# Patient Record
Sex: Female | Born: 1985
Health system: Southern US, Community
[De-identification: ages and names within clinical notes are randomized; demographics above are authoritative.]

## PROBLEM LIST (undated history)

## (undated) DIAGNOSIS — I341 Nonrheumatic mitral (valve) prolapse: Secondary | ICD-10-CM

## (undated) DIAGNOSIS — E282 Polycystic ovarian syndrome: Secondary | ICD-10-CM

## (undated) DIAGNOSIS — F909 Attention-deficit hyperactivity disorder, unspecified type: Secondary | ICD-10-CM

## (undated) DIAGNOSIS — F419 Anxiety disorder, unspecified: Secondary | ICD-10-CM

## (undated) DIAGNOSIS — N289 Disorder of kidney and ureter, unspecified: Secondary | ICD-10-CM

## (undated) HISTORY — PX: TENDON REPAIR: SHX5111

## (undated) HISTORY — PX: BREAST REDUCTION SURGERY: SHX8

---
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
## 2004-07-19 ENCOUNTER — Other Ambulatory Visit: Admission: RE | Admit: 2004-07-19 | Discharge: 2004-07-19 | Payer: Self-pay

## 2004-09-30 ENCOUNTER — Ambulatory Visit: Payer: Self-pay | Admitting: Pulmonary Disease

## 2004-10-06 ENCOUNTER — Ambulatory Visit (HOSPITAL_COMMUNITY): Admission: RE | Admit: 2004-10-06 | Discharge: 2004-10-06 | Payer: Self-pay | Admitting: Pulmonary Disease

## 2009-04-07 ENCOUNTER — Emergency Department (HOSPITAL_COMMUNITY): Admission: EM | Admit: 2009-04-07 | Discharge: 2009-04-08 | Payer: Self-pay | Admitting: Emergency Medicine

## 2009-05-24 ENCOUNTER — Emergency Department (HOSPITAL_COMMUNITY): Admission: EM | Admit: 2009-05-24 | Discharge: 2009-05-24 | Payer: Self-pay | Admitting: Emergency Medicine

## 2010-03-22 ENCOUNTER — Emergency Department (HOSPITAL_BASED_OUTPATIENT_CLINIC_OR_DEPARTMENT_OTHER): Admission: EM | Admit: 2010-03-22 | Discharge: 2010-03-22 | Payer: Self-pay | Admitting: Emergency Medicine

## 2010-07-18 ENCOUNTER — Inpatient Hospital Stay (HOSPITAL_COMMUNITY)
Admission: AD | Admit: 2010-07-18 | Discharge: 2010-07-18 | Disposition: A | Discharge status: Home / Self Care | Payer: 59 | Source: Ambulatory Visit | Attending: Family Medicine | Admitting: Family Medicine

## 2010-07-18 DIAGNOSIS — N949 Unspecified condition associated with female genital organs and menstrual cycle: Secondary | ICD-10-CM | POA: Insufficient documentation

## 2010-07-18 DIAGNOSIS — N938 Other specified abnormal uterine and vaginal bleeding: Secondary | ICD-10-CM | POA: Insufficient documentation

## 2010-07-18 LAB — WET PREP, GENITAL
Trich, Wet Prep: NONE SEEN
Yeast Wet Prep HPF POC: NONE SEEN

## 2010-07-18 LAB — URINALYSIS, ROUTINE W REFLEX MICROSCOPIC
Hgb urine dipstick: NEGATIVE
Protein, ur: NEGATIVE mg/dL
Specific Gravity, Urine: 1.025 (ref 1.005–1.030)
Urobilinogen, UA: 0.2 mg/dL (ref 0.0–1.0)

## 2010-07-18 LAB — POCT PREGNANCY, URINE: Preg Test, Ur: NEGATIVE

## 2010-07-19 LAB — LIPASE, BLOOD: Lipase: 65 U/L (ref 23–300)

## 2010-07-19 LAB — COMPREHENSIVE METABOLIC PANEL
AST: 23 U/L (ref 0–37)
BUN: 11 mg/dL (ref 6–23)
CO2: 21 mEq/L (ref 19–32)
Calcium: 9.4 mg/dL (ref 8.4–10.5)
Chloride: 105 mEq/L (ref 96–112)
Creatinine, Ser: 0.7 mg/dL (ref 0.4–1.2)
GFR calc non Af Amer: >60 mL/min (ref >60)
Glucose, Bld: 115 mg/dL — ABNORMAL HIGH (ref 70–99)
Total Bilirubin: 0.8 mg/dL (ref 0.3–1.2)

## 2010-07-19 LAB — URINALYSIS, ROUTINE W REFLEX MICROSCOPIC
Glucose, UA: NEGATIVE mg/dL
Hgb urine dipstick: NEGATIVE
Ketones, ur: NEGATIVE mg/dL
Protein, ur: NEGATIVE mg/dL
Urobilinogen, UA: 0.2 mg/dL (ref 0.0–1.0)

## 2010-07-19 LAB — DIFFERENTIAL
Basophils Absolute: 0.1 10*3/uL (ref 0.0–0.1)
Eosinophils Relative: 0 % (ref 0–5)
Lymphs Abs: 1.4 10*3/uL (ref 0.7–4.0)
Monocytes Relative: 7 % (ref 3–12)
Neutrophils Relative %: 79 % — ABNORMAL HIGH (ref 43–77)

## 2010-07-19 LAB — CBC
HCT: 39.3 % (ref 36.0–46.0)
Hemoglobin: 13.8 g/dL (ref 12.0–15.0)
MCH: 30 pg (ref 26.0–34.0)
MCHC: 35.2 g/dL (ref 30.0–36.0)
MCV: 85.2 fL (ref 78.0–100.0)
RBC: 4.61 MIL/uL (ref 3.87–5.11)

## 2010-07-19 LAB — GC/CHLAMYDIA PROBE AMP, GENITAL: GC Probe Amp, Genital: NEGATIVE

## 2010-08-09 LAB — DIFFERENTIAL
Basophils Absolute: 0.1 10*3/uL (ref 0.0–0.1)
Eosinophils Absolute: 0.2 10*3/uL (ref 0.0–0.7)
Eosinophils Relative: 2 % (ref 0–5)
Neutrophils Relative %: 41 % — ABNORMAL LOW (ref 43–77)

## 2010-08-09 LAB — POCT I-STAT, CHEM 8
HCT: 38 % (ref 36.0–46.0)
Hemoglobin: 12.9 g/dL (ref 12.0–15.0)
Potassium: 3.8 mEq/L (ref 3.5–5.1)
Sodium: 139 mEq/L (ref 135–145)
TCO2: 24 mmol/L (ref 0–100)

## 2010-08-09 LAB — POCT CARDIAC MARKERS
CKMB, poc: <1 ng/mL — ABNORMAL LOW (ref 1.0–8.0)
Myoglobin, poc: 56.6 ng/mL (ref 12–200)

## 2010-08-09 LAB — CBC
HCT: 36.1 % (ref 36.0–46.0)
MCV: 88.7 fL (ref 78.0–100.0)
Platelets: 270 10*3/uL (ref 150–400)
RDW: 12.1 % (ref 11.5–15.5)
WBC: 10.1 10*3/uL (ref 4.0–10.5)

## 2010-10-08 ENCOUNTER — Emergency Department (HOSPITAL_COMMUNITY): Payer: 59

## 2010-10-08 ENCOUNTER — Emergency Department (HOSPITAL_COMMUNITY)
Admission: EM | Admit: 2010-10-08 | Discharge: 2010-10-08 | Disposition: A | Discharge status: Home / Self Care | Payer: 59 | Attending: Emergency Medicine | Admitting: Emergency Medicine

## 2010-10-08 DIAGNOSIS — N201 Calculus of ureter: Secondary | ICD-10-CM | POA: Insufficient documentation

## 2010-10-08 DIAGNOSIS — R1032 Left lower quadrant pain: Secondary | ICD-10-CM | POA: Insufficient documentation

## 2010-10-08 LAB — BASIC METABOLIC PANEL
CO2: 27 mEq/L (ref 19–32)
Calcium: 10 mg/dL (ref 8.4–10.5)
GFR calc Af Amer: >60 mL/min (ref >60)
GFR calc non Af Amer: >60 mL/min (ref >60)
Sodium: 139 mEq/L (ref 135–145)

## 2010-10-08 LAB — CBC
Hemoglobin: 12.2 g/dL (ref 12.0–15.0)
MCHC: 33.7 g/dL (ref 30.0–36.0)
RDW: 12.7 % (ref 11.5–15.5)
WBC: 7.8 10*3/uL (ref 4.0–10.5)

## 2010-10-08 LAB — URINALYSIS, ROUTINE W REFLEX MICROSCOPIC
Bilirubin Urine: NEGATIVE
Glucose, UA: NEGATIVE mg/dL
Ketones, ur: NEGATIVE mg/dL
Leukocytes, UA: NEGATIVE
pH: 6 (ref 5.0–8.0)

## 2010-10-08 LAB — DIFFERENTIAL
Basophils Absolute: 0.1 10*3/uL (ref 0.0–0.1)
Basophils Relative: 1 % (ref 0–1)
Monocytes Absolute: 0.6 10*3/uL (ref 0.1–1.0)
Neutro Abs: 3.7 10*3/uL (ref 1.7–7.7)

## 2010-10-08 LAB — URINE MICROSCOPIC-ADD ON

## 2011-01-19 ENCOUNTER — Ambulatory Visit (HOSPITAL_BASED_OUTPATIENT_CLINIC_OR_DEPARTMENT_OTHER)
Admission: RE | Admit: 2011-01-19 | Discharge: 2011-01-19 | Disposition: A | Discharge status: Home / Self Care | Payer: 59 | Source: Ambulatory Visit | Attending: Orthopedic Surgery | Admitting: Orthopedic Surgery

## 2011-01-19 DIAGNOSIS — Z01812 Encounter for preprocedural laboratory examination: Secondary | ICD-10-CM | POA: Insufficient documentation

## 2011-01-19 DIAGNOSIS — S61209A Unspecified open wound of unspecified finger without damage to nail, initial encounter: Secondary | ICD-10-CM | POA: Insufficient documentation

## 2011-01-19 DIAGNOSIS — W269XXA Contact with unspecified sharp object(s), initial encounter: Secondary | ICD-10-CM | POA: Insufficient documentation

## 2011-02-15 NOTE — Op Note (Signed)
NAMEKANIJA, REMMEL NO.:  [id number]  MEDICAL RECORD NO.:  [id number]  LOCATION:                                 FACILITY:  PHYSICIAN:  Betha Loa, MD        DATE OF BIRTH:  [birth year]  DATE OF PROCEDURE:  01/19/2011 DATE OF DISCHARGE:                              OPERATIVE REPORT   PREOPERATIVE DIAGNOSIS:  Left thumb laceration, possible extensor pollicis brevis and cutaneous nerve laceration.  POSTOPERATIVE DIAGNOSIS:  Left thumb laceration with partial extensor pollicis longus and partial extensor pollicis brevis laceration.  PROCEDURE:  Left thumb irrigation and debridement, repair of extensor pollicis longus laceration, repair of the extensor pollicis brevis laceration.  SURGEON:  Betha Loa, MD  ASSISTANT:  None.  ANESTHESIA:  General.  IV FLUIDS:  Per anesthesia flow sheet.  ESTIMATED BLOOD LOSS:  Minimal.  COMPLICATIONS:  None.  SPECIMENS:  None.  TOURNIQUET TIME:  29 minutes.  DISPOSITION:  Stable to PACU.  INDICATIONS:  Ms. Barnett Abu is a 25 year old female who was cutting open a package on approximately January 01, 2011, when she accidentally lacerated the dorsal aspect of her left thumb.  She did not seek immediate medical attention.  She saw her primary care physician the following week who referred her to me for further care.  I initially saw her on September, 2012.  At that time, she had pain with flexion of the thumb and had some numbness distal to the wounds dorsally.  We reevaluated her a week later and she had continued numbness and weakness in the thumb that she was able to extend at the MP and IP joints.  We discussed continued nonoperative treatment versus going to the operating room for exploration of the wound with potential repair of damaged structures.  Risks, benefits, and alternatives of the surgery were discussed including the risk of blood loss, infection, damage to nerves, vessels, tendons, ligaments, bone,  failure to surgery, need for additional surgery, complications with wound healing, continued pain, continued weakness and continued numbness.  She voiced understanding these risks and elected to proceed.  OPERATIVE COURSE:  After being identified preoperatively by myself, the patient and I agreed upon procedure and site of procedure.  The surgical site was marked.  The risks, benefits, and alternatives of surgery were reviewed and she wished to proceed.  Surgical consent had been signed. She was given 1 g of IV Ancef as preoperative antibiotic prophylaxis. She was transferred to the operating room, placed on the operating room table in supine position with the left upper extremity on arm board. General anesthesia was induced by the anesthesiologist.  The left upper extremity was prepped and draped in normal sterile orthopedic fashion. A surgical pause was performed between surgeons, Anesthesia, operating staff, and all were in agreement as to the patient, procedure, and site of procedure.  Tourniquet to proximal aspect of the extremity was inflated to 250 mmHg after exsanguination of the limb with an Esmarch bandage.  The wound had nearly fully healed.  There was no erythema or drainage.  It was extended both proximally and distally, was extended distally on the radial side and proximally on the ulnar side.  Spreading technique was used in the subcutaneous tissues.  There was no noted cutaneous nerve that could be repaired.  There was laceration at the level of the MP joint.  There was approximately 25-30% laceration of the radial side of the EPL tendon and complete laceration of the EPB tendon. The tendon ends were debrided.  The wound was copiously irrigated with 500 mL of sterile saline.  There was no gross contamination.  The EPL tendon was repaired using 3-0 Mersilene in a figure-of-eight fashion. The EPB tendon was again repaired using 3-0 Mersilene in a figure-of- eight fashion.   This apposed the tendon edges well.  The skin was then closed with 4-0 nylon in a horizontal mattress fashion.  The wound was injected with 5 mL of 0.25% plain Marcaine to aid in postoperative analgesia.  It was dressed with sterile Xeroform, 4 x 4s, and wrapped with Kerlix.  A thumb spica splint was placed with the wrist in approximately 20 degrees of extension and the thumb in extension as well.  This was wrapped with Kerlix and Ace bandage.  Tourniquet was deflated at 29 minutes.  The fingertips were pink with brisk capillary refill after deflation of the tourniquet.  The operative drapes were broken down.  The patient was awoken from anesthesia safely.  She was transferred back to stretcher and taken to PACU in stable condition.  I will see her back in the office in 1 week for postoperative followup.  I will give her a prescription for Percocet 5/325 one to two p.o. q.6 h. p.r.n. pain, dispensed #40.     Betha Loa, MD   ______________________________ Betha Loa, MD    KK/MEDQ  D:  01/19/2011  T:  01/19/2011  Job:  [id number]  Electronically Signed by Betha Loa  on 02/15/2011 11:26:29 AM

## 2012-07-28 ENCOUNTER — Emergency Department (HOSPITAL_COMMUNITY)
Admission: EM | Admit: 2012-07-28 | Discharge: 2012-07-28 | Disposition: A | Discharge status: Home / Self Care | Payer: 59 | Attending: Emergency Medicine | Admitting: Emergency Medicine

## 2012-07-28 ENCOUNTER — Encounter (HOSPITAL_COMMUNITY): Payer: Self-pay

## 2012-07-28 DIAGNOSIS — R112 Nausea with vomiting, unspecified: Secondary | ICD-10-CM | POA: Insufficient documentation

## 2012-07-28 DIAGNOSIS — Z3202 Encounter for pregnancy test, result negative: Secondary | ICD-10-CM | POA: Insufficient documentation

## 2012-07-28 LAB — CBC WITH DIFFERENTIAL/PLATELET
Basophils Absolute: 0.1 10*3/uL (ref 0.0–0.1)
Basophils Relative: 1 % (ref 0–1)
Eosinophils Absolute: 0 10*3/uL (ref 0.0–0.7)
Eosinophils Relative: 0 % (ref 0–5)
HCT: 41 % (ref 36.0–46.0)
MCH: 29.9 pg (ref 26.0–34.0)
MCHC: 34.9 g/dL (ref 30.0–36.0)
Monocytes Absolute: 0.4 10*3/uL (ref 0.1–1.0)
Neutro Abs: 10 10*3/uL — ABNORMAL HIGH (ref 1.7–7.7)
RDW: 12.6 % (ref 11.5–15.5)

## 2012-07-28 LAB — COMPREHENSIVE METABOLIC PANEL
AST: 20 U/L (ref 0–37)
Albumin: 4.4 g/dL (ref 3.5–5.2)
Calcium: 10.1 mg/dL (ref 8.4–10.5)
Chloride: 99 mEq/L (ref 96–112)
Creatinine, Ser: 0.69 mg/dL (ref 0.50–1.10)
Total Protein: 8.2 g/dL (ref 6.0–8.3)

## 2012-07-28 LAB — URINALYSIS, ROUTINE W REFLEX MICROSCOPIC
Bilirubin Urine: NEGATIVE
Glucose, UA: NEGATIVE mg/dL
Hgb urine dipstick: NEGATIVE
Ketones, ur: NEGATIVE mg/dL
Leukocytes, UA: NEGATIVE
Nitrite: NEGATIVE
Protein, ur: NEGATIVE mg/dL
Specific Gravity, Urine: 1.015 (ref 1.005–1.030)
Urobilinogen, UA: 0.2 mg/dL (ref 0.0–1.0)
pH: 7 (ref 5.0–8.0)

## 2012-07-28 LAB — PREGNANCY, URINE: Preg Test, Ur: NEGATIVE

## 2012-07-28 LAB — LIPASE, BLOOD: Lipase: 21 U/L (ref 11–59)

## 2012-07-28 MED ORDER — ONDANSETRON HCL 4 MG/2ML IJ SOLN
4.0000 mg | INTRAMUSCULAR | Status: AC | PRN
  Administered 2012-07-28 (×2): 4 mg via INTRAVENOUS
  Filled 2012-07-28 (×2): qty 2

## 2012-07-28 MED ORDER — FAMOTIDINE IN NACL 20-0.9 MG/50ML-% IV SOLN
20.0000 mg | Freq: Once | INTRAVENOUS | Status: AC
  Administered 2012-07-28: 20 mg via INTRAVENOUS
  Filled 2012-07-28: qty 50

## 2012-07-28 MED ORDER — ONDANSETRON HCL 4 MG PO TABS: 4.0000 mg | ORAL_TABLET | Freq: Three times a day (TID) | ORAL | Status: DC | PRN

## 2012-07-28 MED ORDER — SODIUM CHLORIDE 0.9 % IV BOLUS (SEPSIS)
1000.0000 mL | Freq: Once | INTRAVENOUS | Status: AC
  Administered 2012-07-28: 1000 mL via INTRAVENOUS

## 2012-07-28 MED ORDER — SODIUM CHLORIDE 0.9 % IV SOLN
INTRAVENOUS | Status: DC
  Administered 2012-07-28: 19:00:00 via INTRAVENOUS

## 2012-07-28 NOTE — ED Notes (Signed)
Pt reports woke up this morning with vomiting, diarrhea, and abd pain.

## 2012-07-28 NOTE — ED Notes (Signed)
The patient states that she started having vomiting this morning around 930 with upper abdominal pain.  States that her right hand feels stiff, also complaining of various muscles cramping.  States that she did try phenergan at home without relief of her nausea or vomiting around 2 pm.

## 2012-07-28 NOTE — ED Provider Notes (Signed)
History     CSN: [id number]  Arrival date & time 07/28/12  1608   First MD Initiated Contact with Patient 07/28/12 1741      Chief Complaint  Patient presents with  . Abdominal Pain  . Emesis  . Diarrhea    HPI Pt was seen at 1745.  Per pt, c/o gradual onset and persistence of multiple intermittent episodes of N/V that began this morning.   Has been associated with upper abd "pain" and feeling like her muscles are "cramping." States multiple others in household have recently had the same symptoms.  Denies diarrhea, no CP/SOB, no back pain, no fevers, no black or blood in stools or emesis.      History reviewed. No pertinent past medical history.  Past Surgical History  Procedure Laterality Date  . Breast reduction surgery    . Tendon repair       History  Substance Use Topics  . Smoking status: Never Smoker   . Smokeless tobacco: Not on file  . Alcohol Use: Yes     Comment: occ    Review of Systems ROS: Statement: All systems negative except as marked or noted in the HPI; Constitutional: Negative for fever and chills. ; ; Eyes: Negative for eye pain, redness and discharge. ; ; ENMT: Negative for ear pain, hoarseness, nasal congestion, sinus pressure and sore throat. ; ; Cardiovascular: Negative for chest pain, palpitations, diaphoresis, dyspnea and peripheral edema. ; ; Respiratory: Negative for cough, wheezing and stridor. ; ; Gastrointestinal: +N/V, abd pain. Negative for diarrhea, blood in stool, hematemesis, jaundice and rectal bleeding. . ; ; Genitourinary: Negative for dysuria, flank pain and hematuria. ; ; Musculoskeletal: Negative for back pain and neck pain. Negative for swelling and trauma.; ; Skin: Negative for pruritus, rash, abrasions, blisters, bruising and skin lesion.; ; Neuro: Negative for headache, lightheadedness and neck stiffness. Negative for weakness, altered level of consciousness , altered mental status, extremity weakness, paresthesias, involuntary  movement, seizure and syncope.       Allergies  Review of patient's allergies indicates no known allergies.  Home Medications   Current Outpatient Rx  Name  Route  Sig  Dispense  Refill  . ALPRAZolam (XANAX) 0.5 MG tablet   Oral   Take 0.5 mg by mouth daily as needed for sleep or anxiety.         . cetirizine (ZYRTEC) 10 MG tablet   Oral   Take 10 mg by mouth daily.         Marland Kitchen escitalopram (LEXAPRO) 10 MG tablet   Oral   Take 10 mg by mouth daily.         . norethindrone-ethinyl estradiol (JUNEL FE,GILDESS FE,LOESTRIN FE) 1-20 MG-MCG tablet   Oral   Take 1 tablet by mouth daily.           BP 105/58  Pulse 80  Temp(Src) 98.1 F (36.7 C) (Oral)  Resp 16  Ht 5\' 5"  (1.651 m)  Wt 168 lb (76.204 kg)  BMI 27.96 kg/m2  SpO2 100%  LMP 07/20/2012  Physical Exam 1750: Physical examination:  Nursing notes reviewed; Vital signs and O2 SAT reviewed;  Constitutional: Well developed, Well nourished, Well hydrated, In no acute distress; Head:  Normocephalic, atraumatic; Eyes: EOMI, PERRL, No scleral icterus; ENMT: Mouth and pharynx normal, Mucous membranes moist; Neck: Supple, Full range of motion, No lymphadenopathy; Cardiovascular: Regular rate and rhythm, No murmur, rub, or gallop; Respiratory: Breath sounds clear & equal bilaterally, No rales, rhonchi, wheezes.  Speaking full sentences with ease, Normal respiratory effort/excursion; Chest: Nontender, Movement normal; Abdomen: Soft, +mild mid-epigastric and LUQ tenderness to palp. No rebound or guarding. Nondistended, Normal bowel sounds; Genitourinary: No CVA tenderness; Extremities: Pulses normal, No tenderness, No edema, No calf edema or asymmetry.; Neuro: AA&Ox3, Major CN grossly intact.  Speech clear. No gross focal motor or sensory deficits in extremities.; Skin: Color normal, Warm, Dry.    ED Course  Procedures    MDM  MDM Reviewed: nursing note and vitals Interpretation: labs   Results for orders placed  during the hospital encounter of 07/28/12  URINALYSIS, ROUTINE W REFLEX MICROSCOPIC      Result Value Range   Color, Urine YELLOW  YELLOW   APPearance HAZY (*) CLEAR   Specific Gravity, Urine 1.015  1.005 - 1.030   pH 7.0  5.0 - 8.0   Glucose, UA NEGATIVE  NEGATIVE mg/dL   Hgb urine dipstick NEGATIVE  NEGATIVE   Bilirubin Urine NEGATIVE  NEGATIVE   Ketones, ur NEGATIVE  NEGATIVE mg/dL   Protein, ur NEGATIVE  NEGATIVE mg/dL   Urobilinogen, UA 0.2  0.0 - 1.0 mg/dL   Nitrite NEGATIVE  NEGATIVE   Leukocytes, UA NEGATIVE  NEGATIVE  PREGNANCY, URINE      Result Value Range   Preg Test, Ur NEGATIVE  NEGATIVE  CBC WITH DIFFERENTIAL      Result Value Range   WBC 12.8 (*) 4.0 - 10.5 K/uL   RBC 4.78  3.87 - 5.11 MIL/uL   Hemoglobin 14.3  12.0 - 15.0 g/dL   HCT 40.9  81.1 - 91.4 %   MCV 85.8  78.0 - 100.0 fL   MCH 29.9  26.0 - 34.0 pg   MCHC 34.9  30.0 - 36.0 g/dL   RDW 78.2  95.6 - 21.3 %   Platelets 372  150 - 400 K/uL   Neutrophils Relative 78 (*) 43 - 77 %   Neutro Abs 10.0 (*) 1.7 - 7.7 K/uL   Lymphocytes Relative 19  12 - 46 %   Lymphs Abs 2.4  0.7 - 4.0 K/uL   Monocytes Relative 3  3 - 12 %   Monocytes Absolute 0.4  0.1 - 1.0 K/uL   Eosinophils Relative 0  0 - 5 %   Eosinophils Absolute 0.0  0.0 - 0.7 K/uL   Basophils Relative 1  0 - 1 %   Basophils Absolute 0.1  0.0 - 0.1 K/uL  COMPREHENSIVE METABOLIC PANEL      Result Value Range   Sodium 137  135 - 145 mEq/L   Potassium 3.9  3.5 - 5.1 mEq/L   Chloride 99  96 - 112 mEq/L   CO2 25  19 - 32 mEq/L   Glucose, Bld 95  70 - 99 mg/dL   BUN 7  6 - 23 mg/dL   Creatinine, Ser 0.86  0.50 - 1.10 mg/dL   Calcium 57.8  8.4 - 46.9 mg/dL   Total Protein 8.2  6.0 - 8.3 g/dL   Albumin 4.4  3.5 - 5.2 g/dL   AST 20  0 - 37 U/L   ALT 20  0 - 35 U/L   Alkaline Phosphatase 70  39 - 117 U/L   Total Bilirubin 0.5  0.3 - 1.2 mg/dL   GFR calc non Af Amer >90  >90 mL/min   GFR calc Af Amer >90  >90 mL/min  LIPASE, BLOOD      Result Value  Range   Lipase 21  11 - 59 U/L    2025:  Pt has tol PO well while in the ED without N/V.  No stooling while in the ED.  Abd benign, VSS. Wants to go home now. Dx and testing d/w pt and family.  Questions answered.  Verb understanding, agreeable to d/c home with outpt f/u.          Laray Anger, DO 07/31/12 [phone]

## 2012-07-28 NOTE — ED Notes (Signed)
Shift report taken from Burnett Corrente, Charity fundraiser. Patient alert and oriented, c/o some nausea, additional 4 zofran given will wait and try PO challenge shortly.

## 2012-07-28 NOTE — ED Notes (Signed)
Pt drinking PO fluids at this time. No complaints of nausea, will reassess shortly.

## 2012-07-28 NOTE — ED Notes (Signed)
Patient still feeling a little nauseated RN aware.

## 2012-07-28 NOTE — ED Notes (Signed)
Patient states she is still feeling some nausea.

## 2013-03-02 ENCOUNTER — Emergency Department (HOSPITAL_COMMUNITY): Payer: 59

## 2013-03-02 ENCOUNTER — Encounter (HOSPITAL_COMMUNITY): Payer: Self-pay | Admitting: Emergency Medicine

## 2013-03-02 ENCOUNTER — Emergency Department (HOSPITAL_COMMUNITY)
Admission: EM | Admit: 2013-03-02 | Discharge: 2013-03-03 | Disposition: A | Discharge status: Home / Self Care | Payer: 59 | Attending: Emergency Medicine | Admitting: Emergency Medicine

## 2013-03-02 DIAGNOSIS — Z79899 Other long term (current) drug therapy: Secondary | ICD-10-CM | POA: Insufficient documentation

## 2013-03-02 DIAGNOSIS — G43909 Migraine, unspecified, not intractable, without status migrainosus: Secondary | ICD-10-CM | POA: Insufficient documentation

## 2013-03-02 MED ORDER — DIPHENHYDRAMINE HCL 50 MG/ML IJ SOLN
25.0000 mg | Freq: Once | INTRAMUSCULAR | Status: AC
  Administered 2013-03-02: 25 mg via INTRAVENOUS
  Filled 2013-03-02: qty 1

## 2013-03-02 MED ORDER — DEXAMETHASONE SODIUM PHOSPHATE 4 MG/ML IJ SOLN
10.0000 mg | Freq: Once | INTRAMUSCULAR | Status: AC
  Administered 2013-03-02: 10 mg via INTRAVENOUS
  Filled 2013-03-02: qty 3

## 2013-03-02 MED ORDER — METOCLOPRAMIDE HCL 5 MG/ML IJ SOLN
10.0000 mg | Freq: Once | INTRAMUSCULAR | Status: AC
  Administered 2013-03-02: 10 mg via INTRAVENOUS
  Filled 2013-03-02: qty 2

## 2013-03-02 MED ORDER — SODIUM CHLORIDE 0.9 % IV SOLN
INTRAVENOUS | Status: DC
  Administered 2013-03-02: 22:00:00 via INTRAVENOUS

## 2013-03-02 NOTE — ED Notes (Signed)
Pt c/o migraine and nausea x 4days. Pt has taken percocet at home with no relief.

## 2013-03-02 NOTE — ED Provider Notes (Signed)
CSN: [id number]     Arrival date & time 03/02/13  2142 History   First MD Initiated Contact with Patient 03/02/13 2151     Chief Complaint  Patient presents with  . Migraine  . Nausea   (Consider location/radiation/quality/duration/timing/severity/associated sxs/prior Treatment) Patient is a 27 y.o. female presenting with migraines. The history is provided by the patient.  Migraine This is a new problem. The current episode started in the past 7 days. The problem has been gradually worsening. Associated symptoms include headaches, nausea and neck pain (soreness). Pertinent negatives include no abdominal pain, chest pain, chills, coughing, fever, rash, sore throat or vomiting. Exacerbated by: movement. She has tried acetaminophen and oral narcotics for the symptoms. The treatment provided no relief.   KENIJAH BENNINGFIELD is a 27 y.o. female who presents to the ED with a headache that started 4 days ago. She has been taking tylenol and Percocet without relief. Diagnosed with migraines as a teenager. Last had a CT scan 6 years ago and was normal. Has been treated by [facility] with different medications but nothing really helps so usually just gets in a dark room and waits until it goes away but this one has not gone away. This is the worst headache of her life.  The pain is located around the left eye and left side of the head.   History reviewed. No pertinent past medical history. Past Surgical History  Procedure Laterality Date  . Breast reduction surgery    . Tendon repair     No family history on file. History  Substance Use Topics  . Smoking status: Never Smoker   . Smokeless tobacco: Not on file  . Alcohol Use: Yes     Comment: occ   OB History   Grav Para Term Preterm Abortions TAB SAB Ect Mult Living                 Review of Systems  Constitutional: Negative for fever and chills.  HENT: Negative for ear pain and sore throat.   Eyes: Positive for photophobia.  Negative for visual disturbance.  Respiratory: Negative for cough and shortness of breath.   Cardiovascular: Negative for chest pain.  Gastrointestinal: Positive for nausea. Negative for vomiting and abdominal pain.  Genitourinary: Negative for dysuria and frequency.  Musculoskeletal: Positive for neck pain (soreness).  Skin: Negative for rash.  Allergic/Immunologic: Negative for immunocompromised state.  Neurological: Positive for light-headedness and headaches.  Psychiatric/Behavioral: The patient is not nervous/anxious.     Allergies  Review of patient's allergies indicates no known allergies.  Home Medications   Current Outpatient Rx  Name  Route  Sig  Dispense  Refill  . ALPRAZolam (XANAX) 0.5 MG tablet   Oral   Take 0.5 mg by mouth daily as needed for sleep or anxiety.         . cetirizine (ZYRTEC) 10 MG tablet   Oral   Take 10 mg by mouth daily.         Marland Kitchen escitalopram (LEXAPRO) 10 MG tablet   Oral   Take 10 mg by mouth daily.         . norethindrone-ethinyl estradiol (JUNEL FE,GILDESS FE,LOESTRIN FE) 1-20 MG-MCG tablet   Oral   Take 1 tablet by mouth daily.         . ondansetron (ZOFRAN) 4 MG tablet   Oral   Take 1 tablet (4 mg total) by mouth every 8 (eight) hours as needed for nausea.  6 tablet   0    BP 135/83  Pulse 85  Temp(Src) 98.3 F (36.8 C)  Resp 20  Ht 5\' 5"  (1.651 m)  Wt 160 lb (72.576 kg)  BMI 26.63 kg/m2  SpO2 100%  LMP 01/31/2013 Physical Exam  Nursing note and vitals reviewed. Constitutional: She is oriented to person, place, and time. She appears well-developed and well-nourished. No distress.  HENT:  Head: Normocephalic.    Right Ear: External ear normal.  Left Ear: External ear normal.  Mouth/Throat: Oropharynx is clear and moist.  Headache around left eye  Eyes: Conjunctivae and EOM are normal. Pupils are equal, round, and reactive to light.  Neck: Normal range of motion. Neck supple. Muscular tenderness present.    Can touch chin to chest without difficulty.  Cardiovascular: Normal rate.   Pulmonary/Chest: Effort normal.  Abdominal: Soft. There is no tenderness.  Musculoskeletal: Normal range of motion. She exhibits no edema and no tenderness.  Neurological: She is alert and oriented to person, place, and time. She has normal strength and normal reflexes. No cranial nerve deficit or sensory deficit. She displays a negative Romberg sign. Coordination and gait normal.  Rapid alternating movements without difficulty.  Skin: Skin is warm and dry.  Psychiatric: She has a normal mood and affect. Her behavior is normal.    ED Course: Dr. Manus Gunning in to examine the patient and agrees no meningeal signs.   Procedures Ct Head Wo Contrast  03/03/2013   CLINICAL DATA:  Headache, nausea.  EXAM: CT HEAD WITHOUT CONTRAST  TECHNIQUE: Contiguous axial images were obtained from the base of the skull through the vertex without intravenous contrast.  COMPARISON:  None.  FINDINGS: There is no evidence of acute intracranial hemorrhage, brain edema, mass lesion, acute infarction, mass effect, or midline shift. Acute infarct may be in apparent on noncontrast CT. No other intra-axial abnormalities are seen, and the ventricles and sulci are within normal limits in size and symmetry. No abnormal extra-axial fluid collections or masses are identified. No significant calvarial abnormality.  IMPRESSION: 1. Negative for bleed or other acute intracranial process.   Electronically Signed   By: Oley Balm M.D.   On: 03/03/2013 00:17   00:30 patient feeling much better after IV hydration, decadron 10 mg, Reglan 10 mg, and Benadryl 25 mg IV  MDM  27 y.o. female with headache that was worse than usual migraine. Much improved after treatment. Stable for discharge home without any immediate complications. No signs of meningitis. She will follow up with her PCP. She will return here as needed for problems.     [id number] [place], [place] 03/03/13  [phone]

## 2013-03-02 NOTE — ED Notes (Signed)
Pt reports migraine x 2 days, nausea with no emesis starting today. Reports taking Percocet at home with no relief. C/o light sensitivity. States that she gets "a bad migraine maybe once every year."

## 2013-03-03 NOTE — ED Provider Notes (Signed)
Medical screening examination/treatment/procedure(s) were conducted as a shared visit with non-physician practitioner(s) and myself.  I personally evaluated the patient during the encounter.  Gradual onset headache similar to previous but more severe.  Denies thunderclap onset.  No fever, vision change, neck pain, vomiting.  CN 2-12 intact, no ataxia on finger to nose, no nystagmus, 5/5 strength throughout, no pronator drift, Romberg negative, normal gait.   EKG Interpretation   None         Glynn Octave, MD 03/03/13 [phone]

## 2014-07-10 ENCOUNTER — Encounter (HOSPITAL_COMMUNITY): Payer: Self-pay | Admitting: Emergency Medicine

## 2014-07-10 ENCOUNTER — Emergency Department (HOSPITAL_COMMUNITY)
Admission: EM | Admit: 2014-07-10 | Discharge: 2014-07-10 | Disposition: A | Discharge status: Home / Self Care | Payer: Self-pay | Attending: Emergency Medicine | Admitting: Emergency Medicine

## 2014-07-10 DIAGNOSIS — Z79899 Other long term (current) drug therapy: Secondary | ICD-10-CM | POA: Insufficient documentation

## 2014-07-10 DIAGNOSIS — W25XXXA Contact with sharp glass, initial encounter: Secondary | ICD-10-CM | POA: Insufficient documentation

## 2014-07-10 DIAGNOSIS — Y9389 Activity, other specified: Secondary | ICD-10-CM | POA: Insufficient documentation

## 2014-07-10 DIAGNOSIS — Z793 Long term (current) use of hormonal contraceptives: Secondary | ICD-10-CM | POA: Insufficient documentation

## 2014-07-10 DIAGNOSIS — Y9289 Other specified places as the place of occurrence of the external cause: Secondary | ICD-10-CM | POA: Insufficient documentation

## 2014-07-10 DIAGNOSIS — Z8679 Personal history of other diseases of the circulatory system: Secondary | ICD-10-CM | POA: Insufficient documentation

## 2014-07-10 DIAGNOSIS — Y998 Other external cause status: Secondary | ICD-10-CM | POA: Insufficient documentation

## 2014-07-10 DIAGNOSIS — S51811A Laceration without foreign body of right forearm, initial encounter: Secondary | ICD-10-CM | POA: Insufficient documentation

## 2014-07-10 HISTORY — DX: Nonrheumatic mitral (valve) prolapse: I34.1

## 2014-07-10 MED ORDER — LIDOCAINE HCL (PF) 1 % IJ SOLN
5.0000 mL | Freq: Once | INTRAMUSCULAR | Status: AC
  Administered 2014-07-10: 5 mL via INTRADERMAL
  Filled 2014-07-10: qty 5

## 2014-07-10 NOTE — Discharge Instructions (Signed)

## 2014-07-10 NOTE — ED Notes (Signed)
Lac to rt forearm  ,cut on broken glass from picture frame.  No active bleeding.

## 2014-07-10 NOTE — ED Notes (Signed)
Pt cut right arm on broken glass

## 2014-07-10 NOTE — ED Provider Notes (Signed)
CSN: [id number]     Arrival date & time 07/10/14  1945 History   First MD Initiated Contact with Patient 07/10/14 2006     Chief Complaint  Patient presents with  . Extremity Laceration     (Consider location/radiation/quality/duration/timing/severity/associated sxs/prior Treatment) HPI   Adrienne Church is a 29 y.o. female who presents to the Emergency Department complaining of laceration to her right forearm.  She states that the glass from a picture frame broke and accidentally cut her as she was cleaning up the broken glass.  She reports mild bleeding that resolved with pressure.  She has not cleaned the wound.  She states that her Td is up to date.  She denies numbness, inability to move her fingers or swelling.      Past Medical History  Diagnosis Date  . MVP (mitral valve prolapse)    Past Surgical History  Procedure Laterality Date  . Breast reduction surgery    . Tendon repair     No family history on file. History  Substance Use Topics  . Smoking status: Never Smoker   . Smokeless tobacco: Not on file  . Alcohol Use: Yes     Comment: occ   OB History    No data available     Review of Systems  Constitutional: Negative for fever and chills.  Musculoskeletal: Negative for back pain, joint swelling and arthralgias.  Skin: Positive for wound.       Laceration right forearm  Neurological: Negative for dizziness, weakness and numbness.  Hematological: Does not bruise/bleed easily.  All other systems reviewed and are negative.     Allergies  Review of patient's allergies indicates no known allergies.  Home Medications   Prior to Admission medications   Medication Sig Start Date End Date Taking? Authorizing Provider  ALPRAZolam Prudy Feeler) 0.5 MG tablet Take 0.5 mg by mouth daily as needed for sleep or anxiety.    Historical Provider, MD  escitalopram (LEXAPRO) 10 MG tablet Take 10 mg by mouth daily.    Historical Provider, MD  norethindrone-ethinyl estradiol  (JUNEL FE,GILDESS FE,LOESTRIN FE) 1-20 MG-MCG tablet Take 1 tablet by mouth daily.    Historical Provider, MD   BP 127/78 mmHg  Pulse 73  Temp(Src) 98.2 F (36.8 C) (Oral)  Resp 18  Ht  (1.651 m)  Wt 145 lb (65.772 kg)  BMI 24.13 kg/m2  SpO2 99%  LMP 06/18/2014 Physical Exam  Constitutional: She is oriented to person, place, and time. She appears well-developed and well-nourished. No distress.  HENT:  Head: Normocephalic and atraumatic.  Cardiovascular: Normal rate, regular rhythm, normal heart sounds and intact distal pulses.   No murmur heard. Pulmonary/Chest: Effort normal and breath sounds normal. No respiratory distress.  Musculoskeletal: She exhibits no edema or tenderness.  Patient has full ROM of the right fingers. Full flexion and extension of the wrist.  Distal sensation intact.  CR< 2 sec  Neurological: She is alert and oriented to person, place, and time. She exhibits normal muscle tone. Coordination normal.  Skin: Skin is warm. Laceration noted.     Superficial laceration to the medial right forearm.  Bleeding controlled.  No edema  Nursing note and vitals reviewed.   ED Course  Procedures (including critical care time) Labs Review Labs Reviewed - No data to display  Imaging Review No results found.   EKG Interpretation None       LACERATION REPAIR Performed by: Ervan Heber L. Authorized by: Maxwell Caul Consent: Verbal consent  obtained. Risks and benefits: risks, benefits and alternatives were discussed Consent given by: patient Patient identity confirmed: provided demographic data Prepped and Draped in normal sterile fashion Wound explored  Laceration Location: right forearm Laceration Length: 2 cm  No Foreign Bodies seen or palpated  Anesthesia: local infiltration  Local anesthetic: lidocaine 1 % w/o epinephrine  Anesthetic total: 2 ml  Irrigation method: syringe Amount of cleaning: standard  Skin closure: 4-0  ethilon Number of sutures:3  Technique: simple interrupted  Patient tolerance: Patient tolerated the procedure well with no immediate complications.   MDM   Final diagnoses:  Laceration of forearm, right, initial encounter    superficial laceration to the right forearm.  Bleeding controlled, remains NV intact.  No motor deficits on exam.  No visual injury to the deep structures.  Pt given wound care instructions.  Sutures out in 8-10 days.  Advised to return for any signs of infection    Reesa Gotschall L. Trisha Mangleriplett, PA-C 07/11/14 [id number]  Benny LennertJoseph L Zammit, MD 07/11/14 [phone]

## 2015-05-20 ENCOUNTER — Encounter (HOSPITAL_COMMUNITY): Payer: Self-pay | Admitting: *Deleted

## 2015-05-20 ENCOUNTER — Emergency Department (HOSPITAL_COMMUNITY): Payer: Managed Care, Other (non HMO)

## 2015-05-20 ENCOUNTER — Emergency Department (HOSPITAL_COMMUNITY)
Admission: EM | Admit: 2015-05-20 | Discharge: 2015-05-20 | Disposition: A | Discharge status: Home / Self Care | Payer: Managed Care, Other (non HMO) | Attending: Emergency Medicine | Admitting: Emergency Medicine

## 2015-05-20 DIAGNOSIS — Z3202 Encounter for pregnancy test, result negative: Secondary | ICD-10-CM | POA: Insufficient documentation

## 2015-05-20 DIAGNOSIS — R110 Nausea: Secondary | ICD-10-CM | POA: Diagnosis not present

## 2015-05-20 DIAGNOSIS — M545 Low back pain: Secondary | ICD-10-CM | POA: Diagnosis not present

## 2015-05-20 DIAGNOSIS — Z87442 Personal history of urinary calculi: Secondary | ICD-10-CM | POA: Diagnosis not present

## 2015-05-20 DIAGNOSIS — R102 Pelvic and perineal pain: Secondary | ICD-10-CM

## 2015-05-20 DIAGNOSIS — R109 Unspecified abdominal pain: Secondary | ICD-10-CM

## 2015-05-20 DIAGNOSIS — N760 Acute vaginitis: Secondary | ICD-10-CM | POA: Diagnosis not present

## 2015-05-20 DIAGNOSIS — B9689 Other specified bacterial agents as the cause of diseases classified elsewhere: Secondary | ICD-10-CM

## 2015-05-20 HISTORY — DX: Disorder of kidney and ureter, unspecified: N28.9

## 2015-05-20 LAB — WET PREP, GENITAL
Sperm: NONE SEEN
Trich, Wet Prep: NONE SEEN
YEAST WET PREP: NONE SEEN

## 2015-05-20 LAB — URINALYSIS, ROUTINE W REFLEX MICROSCOPIC
BILIRUBIN URINE: NEGATIVE
Glucose, UA: NEGATIVE mg/dL
HGB URINE DIPSTICK: NEGATIVE
KETONES UR: NEGATIVE mg/dL
Leukocytes, UA: NEGATIVE
NITRITE: NEGATIVE
PROTEIN: NEGATIVE mg/dL
SPECIFIC GRAVITY, URINE: 1.015 (ref 1.005–1.030)
pH: 7 (ref 5.0–8.0)

## 2015-05-20 LAB — PREGNANCY, URINE: PREG TEST UR: NEGATIVE

## 2015-05-20 MED ORDER — KETOROLAC TROMETHAMINE 60 MG/2ML IM SOLN
60.0000 mg | Freq: Once | INTRAMUSCULAR | Status: AC
  Administered 2015-05-20: 60 mg via INTRAMUSCULAR
  Filled 2015-05-20: qty 2

## 2015-05-20 MED ORDER — NAPROXEN 250 MG PO TABS: 250.0000 mg | ORAL_TABLET | Freq: Two times a day (BID) | ORAL | Status: DC | PRN

## 2015-05-20 MED ORDER — METRONIDAZOLE 500 MG PO TABS: 500.0000 mg | ORAL_TABLET | Freq: Two times a day (BID) | ORAL | Status: DC

## 2015-05-20 NOTE — Discharge Instructions (Signed)
°Emergency Department Resource Guide °1) Find a Doctor and Pay Out of Pocket °Although you won't have to find out who is covered by your insurance plan, it is a good idea to ask around and get recommendations. You will then need to call the office and see if the doctor you have chosen will accept you as a new patient and what types of options they offer for patients who are self-pay. Some doctors offer discounts or will set up payment plans for their patients who do not have insurance, but you will need to ask so you aren't surprised when you get to your appointment. ° °2) Contact Your Local Health Department °Not all health departments have doctors that can see patients for sick visits, but many do, so it is worth a call to see if yours does. If you don't know where your local health department is, you can check in your phone book. The CDC also has a tool to help you locate your state's health department, and many state websites also have listings of all of their local health departments. ° °3) Find a Walk-in Clinic °If your illness is not likely to be very severe or complicated, you may want to try a walk in clinic. These are popping up all over the country in pharmacies, drugstores, and shopping centers. They're usually staffed by nurse practitioners or physician assistants that have been trained to treat common illnesses and complaints. They're usually fairly quick and inexpensive. However, if you have serious medical issues or chronic medical problems, these are probably not your best option. ° °No Primary Care Doctor: °- Call [facility] at  832-8000 - they can help you locate a primary care doctor that  accepts your insurance, provides certain services, etc. °- Physician Referral Service- 1-800-533-3463 ° °Chronic Pain Problems: °Organization         Address  Phone   Notes  °[facility]  (336) 297-2271 Patients need to be referred by their primary care doctor.  ° °Medication  Assistance: °Organization         Address  Phone   Notes  °[place] [facility] [address] °[place], [place] [address] (336) 641-8030 --Must be a resident of [place] °-- Must have NO insurance coverage whatsoever (no Medicaid/ Medicare, etc.) °-- The pt. MUST have a primary care doctor that directs their care regularly and follows them in the community °  °[facility]  (866) 331-1348   °[employer]  (888) 892-1162   ° °Agencies that provide inexpensive medical care: °Organization         Address  Phone   Notes  °[facility]  (336) 832-8035   °[facility]    (336) 832-7272   °[facility] [address] °[place], [place] [address] (336) 832-4777   °[facility] [address], °[place] (336) 271-4999   °[facility]    (336) 373-0678   °[facility]    (336) 272-1050   °[facility] ° [address], [place] Phone:  (336) 832-4444, Fax:  (336) 832-4440 Hours of Operation:  9 am - 6 pm, M-F.  Also accepts Medicaid/Medicare and self-pay.  °[facility] ° [address], [place] Phone: (336) 832-3150, Fax: (336) [phone]. Hours of Operation:  8:30 am - 5:30 pm, M-F.  Also accepts Medicaid and self-pay.  °[facility] [place] [address], [place] Phone: (336) 878-6027   °[facility] [address], [place], [place] (336)723-1848, Ext. 123 Mondays & Thursdays: 7-9 AM.  First 15 patients are seen on a first come, first serve basis. °  ° °Medicaid-accepting [place] Providers: ° °Organization         Address  Phone   Notes  °[facility] [address], [place] (336) 641-2100 Also accepts self-pay patients.  °[facility] [address], [place] ° (336) 856-9996   °[facility] [address], [place]  (336) 288-8857   °[facility] [address] [place] [address], [place] (336) 299-7000   °Veita Bland [address], [place]  ° (336) 373-1557 Only accepts [place] Access Medicaid patients after they have their name applied to their card.  ° °Self-Pay (no insurance) in [place]: ° °Organization         Address  Phone   Notes  °Sickle Cell Patients, [facility] [address], [place] (336) 832-1970   °[facility] [address], [place] (336) 832-4400   °[facility] ° [address], [place] (336) 992-4800   °[facility]/Dr. Osei-Bonsu ° [address] [place] [address], [place] or [address], [place] (336) 841-8500 Phone number for both [place] and [place] locations is the same.  °[facility] [address], [place] (336) 299-0000   °[facility] [address] [place] [address], [place] or [address] (336) 852-7530 °(336) 878-2260   °[facility] [address], [place] (336) 350-1642, phone; (336) 294-5005, fax Sees patients 1st and 3rd Saturday of every month.  Must not qualify for public or private insurance (i.e. Medicaid, Medicare, [place] Health Choice, Veterans' Benefits) • Household income should be no more than 200% of the poverty level •The clinic cannot treat you if you are pregnant or think you are pregnant • Sexually transmitted diseases are not treated at the clinic.  ° ° °Dental Care: °Organization         Address  Phone  Notes  °[place] [facility] [address], [place] (336) 641-6152 Accepts children up to age 21 who are enrolled in Medicaid or [place] Health Choice; pregnant women with a Medicaid card; and children who have applied for Medicaid or [place] Health Choice, but were declined, whose parents can pay a reduced fee at time of service.  °[place]  [facility] [place]  [address], [place] (336) 641-7733 Accepts children up to age 21 who are enrolled in Medicaid or [place] Health Choice; pregnant women with a Medicaid card; and children who have applied for Medicaid or [place] Health Choice, but were declined, whose parents can pay a reduced fee at time of service.  °[facility] ° [address], [place] (336) 641-4533 Patients are seen by appointment only. Walk-ins are not accepted. [facility] will see patients 18 years of age and older. °Monday - Tuesday (8am-5pm) °Most Wednesdays (8:30-5pm) °$30 per visit, cash only  °[facility] ° [address], [place] (336) 641-4533 Patients are seen by appointment only. Walk-ins are not accepted. [facility] will see patients 18 years of age and older. °One   Wednesday Evening (Monthly: Volunteer Based).  $30 per visit, cash only  °[facility]  (919) 537-3737 for adults; Children under age 4, call Graduate Pediatric Dentistry at (919) [phone]. Children aged 4-14, please call (919) [phone] to request a pediatric application. ° Dental services are provided in all areas of dental care including fillings, crowns and bridges, complete and partial dentures, implants, gum treatment, root canals, and extractions. Preventive care is also provided. Treatment is provided to both adults and children. °Patients are selected via a lottery and there is often a waiting list. °  °[facility] [address], °[place] ° (336) 763-8833 [email or web address] °  °[facility] [address], [place], [place] (336)723-1848, Ext. 123 Second and Fourth Thursday of each month, opens at 6:30 AM; Clinic ends at 9 AM.  Patients are seen on a first-come first-served basis, and a limited number are seen during each clinic.  ° °[facility] ° [address], [place], [place] (336) 723-7904    Eligibility Requirements °You must have lived in [place], [place], or [place] counties for at least the last three months. °  You cannot be eligible for state or federal sponsored healthcare insurance, including Veterans Administration, Medicaid, or Medicare. °  You generally cannot be eligible for healthcare insurance through your employer.  °  How to apply: °Eligibility screenings are held every Tuesday and Wednesday afternoon from 1:00 pm until 4:00 pm. You do not need an appointment for the interview!  °[facility] [address], [place], [place] 336-631-2330   °[facility]  336-342-8273   °[facility]  336-703-3100   °[facility]  336-570-6415   ° °Behavioral Health Resources in the Community: °Intensive Outpatient Programs °Organization         Address  Phone  Notes  °[facility] [address], [place], [place] 336-878-6098   °[facility] [address], [place], [place] 336-832-9800   °[facility] [address], [place], [place] ° 336-882-2125   °[facility] [address],  °[place], [place] 1-800-853-5163 or 336-641-4981   °Substance Abuse Resources °Organization         Address  Phone  Notes  °[facility]  336-882-2125   °[facility]  336-784-9470   °[facility]  336-285-9073   °[facility]  336-845-3988   °Residential & Outpatient Substance Abuse Program  1-800-659-3381   °Psychological Services °Organization         Address  Phone  Notes  °[facility]  336- 832-9600   °[facility]  336- 378-7881   °[facility] [address], [place] 1-800-853-5163 or 336-641-4981   ° °Mobile Crisis Teams °Organization         Address  Phone  Notes  °[facility], Mobile Crisis Care Unit  1-877-626-1772   °Assertive °[facility] ° [address]  [place], [place] 336-834-9664   °Sharon DeEsch [address] °[place] 336-554-5454   ° °Self-Help/Support Groups °Organization         Address  Phone             Notes  °[facility] of  - variety of support groups  336- 373-1402 Call for more information  °Narcotics Anonymous (NA), [facility] [address], °[place]  2 meetings at this location  ° °  Residential Treatment Programs Organization         Address  Phone  Notes  [facility] [address],    [place]  [phone])[phone]   [facility]  [address], [place] [phone], [place], [place] [phone]([phone]) [phone]   [facility] [address], [place] [phone]) [phone] Admissions: 8am-3pm M-F  [facility] [address]    [place], [place] [phone]   [facility] [address], [place], [place] [phone]   [facility] [address]  [place], [place] [phone]   [facility] Intensive Outpatient [address], [place], [place] [phone]   [facility] ([facility] [address]  [place], [place] [phone] or (743)[phone]   [facility] ([facility] [address] [place], [place] [phone] Accepts Medicaid  [facility] [address]  [place] [phone] Substance Abuse/Addiction Treatment   [facility]         Address  Phone  Notes  [facility]  509-045-5683(888) [phone]   Angie FavaJulie Brannon, PhD [address] [place], [place]   [phone](336) [phone] or [phone](336) ([phone]   [facility]   [address] [place], [place] [phone](336) ([phone]) [phone]   [facility] [address], [place], [place] [phone](336) (564) [phone] Insurance/Medicaid/sponsorship through [facility]  [facility] [address]                                    [place], [place] [phone](336) [phone]) [phone] Therapy/tele-psych/case    [facility] [address]   [place], [place] [phone](336) [phone]    Dr. Lolly MustacheArfeen  [phone]) [phone]   [facility]  [employer] [facility] 1) [address], [place] 2) [address], [place] 3)  [address], [place] [phone](336) [phone](336) [phone]  [phone](336) [phone]   [facility] Child Abuse Hotline 615-581-5854(336) 220-416-7377 or [phone](336) [phone] (After Hours)      Take the prescriptions as directed. Take over the counter tylenol, as directed on packaging, as needed for discomfort.  Apply moist heat to the area(s) of discomfort, for 15 minutes at a time, several times per day for the next few days.  Do not fall asleep on a heating pack.  Call your regular medical and OB/GYN doctors tomorrow to schedule a follow up appointment this week.  Return to the Emergency Department immediately if worsening.

## 2015-05-20 NOTE — ED Notes (Signed)
Pt states she woke up at 0100 with lower abdominal pain. Pain is constant with a dull ache and intermittent sharp pain. States same pain as last time she was dx with kidney stones. Nausea.

## 2015-05-20 NOTE — ED Provider Notes (Signed)
CSN: [id number]     Arrival date & time 05/20/15  1727 History   First MD Initiated Contact with Patient 05/20/15 1828     Chief Complaint  Patient presents with  . Abdominal Pain     HPI  Pt was seen at 1840.  Per pt, c/o gradual onset and persistence of constant pelvic "pain" that began this morning.  Has been associated with nausea.  Describes the abd pain as "a dull ache," and "like the last time they told me I had a kidney stone."  Denies vomiting/diarrhea, no fevers, no back pain, no rash, no CP/SOB, no black or blood in stools, no dysuria/hematuria, no vaginal bleeding/discharge.      Past Medical History  Diagnosis Date  . MVP (mitral valve prolapse)   . Renal disorder     kidney stones   Past Surgical History  Procedure Laterality Date  . Breast reduction surgery    . Tendon repair      Social History  Substance Use Topics  . Smoking status: Never Smoker   . Smokeless tobacco: None  . Alcohol Use: Yes     Comment: occ    Review of Systems ROS: Statement: All systems negative except as marked or noted in the HPI; Constitutional: Negative for fever and chills. ; ; Eyes: Negative for eye pain, redness and discharge. ; ; ENMT: Negative for ear pain, hoarseness, nasal congestion, sinus pressure and sore throat. ; ; Cardiovascular: Negative for chest pain, palpitations, diaphoresis, dyspnea and peripheral edema. ; ; Respiratory: Negative for cough, wheezing and stridor. ; ; Gastrointestinal: +nausea. Negative for vomiting, diarrhea, abdominal pain, blood in stool, hematemesis, jaundice and rectal bleeding. . ; ; Genitourinary: Negative for dysuria, flank pain and hematuria. ; ; GYN:  +pelvic pain, no vaginal bleeding, no vaginal discharge, no vulvar pain. ;; Musculoskeletal: +LBP. Negative for neck pain. Negative for swelling and trauma.; ; Skin: Negative for pruritus, rash, abrasions, blisters, bruising and skin lesion.; ; Neuro: Negative for headache, lightheadedness and neck  stiffness. Negative for weakness, altered level of consciousness , altered mental status, extremity weakness, paresthesias, involuntary movement, seizure and syncope.      Allergies  Review of patient's allergies indicates no known allergies.  Home Medications   Prior to Admission medications   Medication Sig Start Date End Date Taking? Authorizing Provider  acetaminophen (TYLENOL) 500 MG tablet Take 500 mg by mouth every 6 (six) hours as needed for mild pain, moderate pain or headache.   Yes Historical Provider, MD   BP 137/72 mmHg  Pulse 79  Temp(Src) 98.7 F (37.1 C) (Oral)  Resp 16  Ht 5\' 5"  (1.651 m)  Wt 145 lb (65.772 kg)  BMI 24.13 kg/m2  SpO2 100%  LMP 04/28/2015 Physical Exam  1845: Physical examination:  Nursing notes reviewed; Vital signs and O2 SAT reviewed;  Constitutional: Well developed, Well nourished, Well hydrated, In no acute distress; Head:  Normocephalic, atraumatic; Eyes: EOMI, PERRL, No scleral icterus; ENMT: Mouth and pharynx normal, Mucous membranes moist; Neck: Supple, Full range of motion, No lymphadenopathy; Cardiovascular: Regular rate and rhythm, No murmur, rub, or gallop; Respiratory: Breath sounds clear & equal bilaterally, No rales, rhonchi, wheezes.  Speaking full sentences with ease, Normal respiratory effort/excursion; Chest: Nontender, Movement normal; Abdomen: Soft, +suprapubic tenderness to palp. No rebound or guarding. Nondistended, Normal bowel sounds; Genitourinary: No CVA tenderness. Pelvic exam performed with permission of pt and female ED tech assist during exam.  External genitalia w/o lesions. Vaginal vault without  discharge.  Cervix w/o lesions, not friable, GC/chlam and wet prep obtained and sent to lab.  Bimanual exam w/o CMT or bilateral adnexal tenderness. +suprapubic tenderness;; Extremities: Pulses normal, No tenderness, No edema, No calf edema or asymmetry.; Neuro: AA&Ox3, Major CN grossly intact.  Speech clear. No gross focal motor or  sensory deficits in extremities.; Skin: Color normal, Warm, Dry.   ED Course  Procedures (including critical care time)  Labs Review  Imaging Review  I have personally reviewed and evaluated these images and lab results as part of my medical decision-making.   EKG Interpretation None      MDM  MDM Reviewed: previous chart, nursing note and vitals Reviewed previous: labs Interpretation: labs and CT scan     Results for orders placed or performed during the hospital encounter of 05/20/15  Wet prep, genital  Result Value Ref Range   Yeast Wet Prep HPF POC NONE SEEN NONE SEEN   Trich, Wet Prep NONE SEEN NONE SEEN   Clue Cells Wet Prep HPF POC PRESENT (A) NONE SEEN   WBC, Wet Prep HPF POC MODERATE (A) NONE SEEN   Sperm NONE SEEN   Urinalysis, Routine w reflex microscopic  Result Value Ref Range   Color, Urine YELLOW YELLOW   APPearance CLEAR CLEAR   Specific Gravity, Urine 1.015 1.005 - 1.030   pH 7.0 5.0 - 8.0   Glucose, UA NEGATIVE NEGATIVE mg/dL   Hgb urine dipstick NEGATIVE NEGATIVE   Bilirubin Urine NEGATIVE NEGATIVE   Ketones, ur NEGATIVE NEGATIVE mg/dL   Protein, ur NEGATIVE NEGATIVE mg/dL   Nitrite NEGATIVE NEGATIVE   Leukocytes, UA NEGATIVE NEGATIVE  Pregnancy, urine  Result Value Ref Range   Preg Test, Ur NEGATIVE NEGATIVE   Ct Renal Stone Study 05/20/2015  CLINICAL DATA:  Lower abdominal pain since 1 a.m. today. Nausea. History of urinary tract stones. EXAM: CT ABDOMEN AND PELVIS WITHOUT CONTRAST TECHNIQUE: Multidetector CT imaging of the abdomen and pelvis was performed following the standard protocol without IV contrast. COMPARISON:  CT abdomen and pelvis 10/08/2010. FINDINGS: The lung bases are clear. There is no pleural or pericardial effusion. The kidneys appear normal bilaterally. There is no hydronephrosis. No urinary tract stones are present. Urinary bladder, uterus and adnexa are unremarkable. The gallbladder, liver, adrenal glands, spleen and pancreas  appear normal. The stomach, small and large bowel and appendix are unremarkable. No lymphadenopathy or fluid. No focal bony abnormality is identified. IMPRESSION: Negative for urinary tract stone.  Negative CT abdomen and pelvis. Electronically Signed   By: Drusilla Kanner M.D.   On: 05/20/2015 20:05    2120:  Will tx for BV while GC/chlam pending. Workup otherwise reassuring. Dx and testing d/w pt.  Questions answered.  Verb understanding, agreeable to d/c home with outpt f/u.   Samuel Jester, DO 05/22/15 2338

## 2015-05-24 LAB — GC/CHLAMYDIA PROBE AMP (~~LOC~~) NOT AT ARMC
CHLAMYDIA, DNA PROBE: NEGATIVE
NEISSERIA GONORRHEA: NEGATIVE

## 2015-11-01 DIAGNOSIS — F419 Anxiety disorder, unspecified: Secondary | ICD-10-CM | POA: Insufficient documentation

## 2015-11-01 DIAGNOSIS — Z889 Allergy status to unspecified drugs, medicaments and biological substances status: Secondary | ICD-10-CM | POA: Insufficient documentation

## 2015-11-22 ENCOUNTER — Encounter: Payer: Self-pay | Admitting: Obstetrics and Gynecology

## 2015-11-22 ENCOUNTER — Ambulatory Visit (INDEPENDENT_AMBULATORY_CARE_PROVIDER_SITE_OTHER): Payer: Managed Care, Other (non HMO) | Admitting: Obstetrics and Gynecology

## 2015-11-22 VITALS — BP 130/80 | Ht 65.0 in | Wt 149.5 lb

## 2015-11-22 DIAGNOSIS — Z3169 Encounter for other general counseling and advice on procreation: Secondary | ICD-10-CM | POA: Insufficient documentation

## 2015-11-22 DIAGNOSIS — Z3009 Encounter for other general counseling and advice on contraception: Secondary | ICD-10-CM | POA: Diagnosis not present

## 2015-11-22 DIAGNOSIS — N926 Irregular menstruation, unspecified: Secondary | ICD-10-CM

## 2015-11-22 DIAGNOSIS — Z3141 Encounter for fertility testing: Secondary | ICD-10-CM

## 2015-11-22 NOTE — Progress Notes (Signed)
Patient ID: Adrienne Church, female   DOB: [birth year], 30 y.o.   MRN: [id number]    [facility]  @DATE @            Patient name: Adrienne Church MRN [id number]  Date of birth: [birth year]  CC & HPI:  Adrienne Church is a 30 y.o. female presenting today for discussion of conception strategies. Pt has one child and has been pregnant once from a previous relationship. She does note she was 2 weeks late for her period several weeks ago, had several positive home pregnancy tests followed by bleeding similar to a period. Her cycles last from 28-32 days, and are regular within 2-3 days. She states that she uses a period tracker app "Glow" and her LMP was 11/08/15. Her last period prior to that was 09/28/15. Her current partner has not had any children from prior relationships. Pt states she has been off birth control for 9 months. She states she has not tried any OTC ovulation guides. No h/o PID, abdominal infections or abdominal surgeries. No h/o DM, HTN.   ROS:  Review of Systems  Constitutional:       No complaints; discussion only    Pertinent History Reviewed:   Reviewed Medical         Past Medical History  Diagnosis Date  . MVP (mitral valve prolapse)   . Renal disorder     kidney stones                              Surgical Hx:    Past Surgical History  Procedure Laterality Date  . Breast reduction surgery    . Tendon repair     Medications: Reviewed & Updated - see associated section                       Current outpatient prescriptions:  .  acetaminophen (TYLENOL) 500 MG tablet, Take 500 mg by mouth every 6 (six) hours as needed for mild pain, moderate pain or headache., Disp: , Rfl:  .  fexofenadine (ALLEGRA) 60 MG tablet, Take 60 mg by mouth., Disp: , Rfl:  .  fluticasone (FLONASE) 50 MCG/ACT nasal spray, , Disp: , Rfl:  .  naproxen (NAPROSYN) 250 MG tablet, Take 1 tablet (250 mg total) by mouth 2 (two) times daily as needed for mild pain or moderate pain (take with  food)., Disp: 14 tablet, Rfl: 0   Social History: Reviewed -  reports that she has never smoked. She has never used smokeless tobacco.  Objective Findings:  Vitals: Blood pressure 130/80, height 5\' 5"  (1.651 m), weight 149 lb 8 oz (67.813 kg), last menstrual period 11/08/2015.  Physical Examination: Discussion only  Discussed with pt fertility methods. At end of discussion, pt had opportunity to ask questions and has no further questions at this time.   Greater than 50% was spent in counseling and coordination of care with the patient. Total time greater than: 15 minutes    Assessment & Plan:   A:  1. Encounter for fertility discussion  2. Advised pt to utilize ovulation guides: clear plan easy, one step   P:  1. Thyroid function test, CMET, CBC and progesterone level on day 21 of cycle, next week  2. Advised semen analysis for partner     By signing my name below, I, Doreatha MartinEva Mathews, attest that this documentation has been prepared  under the direction and in the presence of Tilda Burrow, MD. Electronically Signed: Doreatha Martin, ED Scribe. 11/22/2015. 10:29 AM.  I personally performed the services described in this documentation, which was SCRIBED in my presence. The recorded information has been reviewed and considered accurate. It has been edited as necessary during review. Tilda Burrow, MD

## 2015-11-30 LAB — CBC
Hematocrit: 40 % (ref 34.0–46.6)
Hemoglobin: 13.1 g/dL (ref 11.1–15.9)
MCH: 29.3 pg (ref 26.6–33.0)
MCHC: 32.8 g/dL (ref 31.5–35.7)
MCV: 90 fL (ref 79–97)
Platelets: 275 10*3/uL (ref 150–379)
RBC: 4.47 x10E6/uL (ref 3.77–5.28)
RDW: 13.3 % (ref 12.3–15.4)
WBC: 7.3 10*3/uL (ref 3.4–10.8)

## 2015-11-30 LAB — PROGESTERONE: Progesterone: 8.3 ng/mL

## 2015-11-30 LAB — COMPREHENSIVE METABOLIC PANEL WITH GFR
ALT: 9 [IU]/L (ref 0–32)
AST: 14 [IU]/L (ref 0–40)
Albumin/Globulin Ratio: 1.7 (ref 1.2–2.2)
Albumin: 4.5 g/dL (ref 3.5–5.5)
Alkaline Phosphatase: 50 [IU]/L (ref 39–117)
BUN/Creatinine Ratio: 12 (ref 9–23)
BUN: 9 mg/dL (ref 6–20)
Bilirubin Total: 0.3 mg/dL (ref 0.0–1.2)
CO2: 23 mmol/L (ref 18–29)
Calcium: 9.3 mg/dL (ref 8.7–10.2)
Chloride: 103 mmol/L (ref 96–106)
Creatinine, Ser: 0.75 mg/dL (ref 0.57–1.00)
GFR calc Af Amer: 124 mL/min/{1.73_m2}
GFR calc non Af Amer: 107 mL/min/{1.73_m2}
Globulin, Total: 2.7 g/dL (ref 1.5–4.5)
Glucose: 85 mg/dL (ref 65–99)
Potassium: 4.5 mmol/L (ref 3.5–5.2)
Sodium: 141 mmol/L (ref 134–144)
Total Protein: 7.2 g/dL (ref 6.0–8.5)

## 2015-11-30 LAB — TSH: TSH: 2.41 u[IU]/mL (ref 0.450–4.500)

## 2015-12-06 ENCOUNTER — Ambulatory Visit (INDEPENDENT_AMBULATORY_CARE_PROVIDER_SITE_OTHER): Payer: Managed Care, Other (non HMO) | Admitting: Obstetrics and Gynecology

## 2015-12-06 ENCOUNTER — Encounter: Payer: Self-pay | Admitting: Obstetrics and Gynecology

## 2015-12-06 VITALS — BP 118/60 | Ht 65.0 in | Wt 150.0 lb

## 2015-12-06 DIAGNOSIS — Z3141 Encounter for fertility testing: Secondary | ICD-10-CM

## 2015-12-06 DIAGNOSIS — Z3169 Encounter for other general counseling and advice on procreation: Secondary | ICD-10-CM | POA: Diagnosis not present

## 2015-12-06 NOTE — Progress Notes (Signed)
   [facility] Clinic Visit  @DATE @            Patient name: SHIRLYN SUTLIFF MRN [id number]  Date of birth: 1985-08-14  CC & HPI:  ALEXIES JETTER is a 30 y.o. female presenting today for Follow-up of initial fertility testing. She had a TSH last week which was normal, and the 2 range, serum progesterone of 8.3 indicating ovulation. She had ovulation guide which indicated ovulation on day 14 approximately, she is using clear plan blue brand ovulation guide,. Partner has not had a semen analysis done  ROS:  ROS   Pertinent History Reviewed:   Reviewed: Significant for Now cycle day 28 Medical         Past Medical History:  Diagnosis Date  . MVP (mitral valve prolapse)   . Renal disorder    kidney stones                              Surgical Hx:    Past Surgical History:  Procedure Laterality Date  . BREAST REDUCTION SURGERY    . TENDON REPAIR     Medications: Reviewed & Updated - see associated section                       Current Outpatient Prescriptions:  .  acetaminophen (TYLENOL) 500 MG tablet, Take 500 mg by mouth every 6 (six) hours as needed for mild pain, moderate pain or headache., Disp: , Rfl:  .  fexofenadine (ALLEGRA) 60 MG tablet, Take 60 mg by mouth., Disp: , Rfl:  .  fluticasone (FLONASE) 50 MCG/ACT nasal spray, , Disp: , Rfl:    Social History: Reviewed -  reports that she has never smoked. She has never used smokeless tobacco.  Objective Findings:  Vitals: Blood pressure 118/60, height 5\' 5"  (1.651 m), weight 150 lb (68 kg), last menstrual period 11/08/2015.  Physical Examination: Discussion only   Assessment & Plan:   A:  1. Fertility counseling, evidence of ovulation per serum progesterone  P:  1. We'll repeat progesterone next cycle if patient has withdrawal menses will give lab slip today

## 2015-12-16 ENCOUNTER — Encounter (HOSPITAL_COMMUNITY): Payer: Self-pay

## 2015-12-16 ENCOUNTER — Emergency Department (HOSPITAL_COMMUNITY)
Admission: EM | Admit: 2015-12-16 | Discharge: 2015-12-16 | Disposition: A | Discharge status: Home / Self Care | Payer: Managed Care, Other (non HMO) | Attending: Emergency Medicine | Admitting: Emergency Medicine

## 2015-12-16 DIAGNOSIS — R112 Nausea with vomiting, unspecified: Secondary | ICD-10-CM | POA: Insufficient documentation

## 2015-12-16 DIAGNOSIS — R197 Diarrhea, unspecified: Secondary | ICD-10-CM | POA: Insufficient documentation

## 2015-12-16 LAB — COMPREHENSIVE METABOLIC PANEL
ALBUMIN: 4.4 g/dL (ref 3.5–5.0)
ALT: 16 U/L (ref 14–54)
AST: 20 U/L (ref 15–41)
Alkaline Phosphatase: 52 U/L (ref 38–126)
Anion gap: 7 (ref 5–15)
BILIRUBIN TOTAL: 0.2 mg/dL — AB (ref 0.3–1.2)
BUN: 11 mg/dL (ref 6–20)
CHLORIDE: 107 mmol/L (ref 101–111)
CO2: 23 mmol/L (ref 22–32)
CREATININE: 0.78 mg/dL (ref 0.44–1.00)
Calcium: 9.1 mg/dL (ref 8.9–10.3)
GFR calc Af Amer: >60 mL/min (ref >60)
GFR calc non Af Amer: >60 mL/min (ref >60)
GLUCOSE: 125 mg/dL — AB (ref 65–99)
POTASSIUM: 3.3 mmol/L — AB (ref 3.5–5.1)
Sodium: 137 mmol/L (ref 135–145)
TOTAL PROTEIN: 7.9 g/dL (ref 6.5–8.1)

## 2015-12-16 LAB — CBC WITH DIFFERENTIAL/PLATELET
Basophils Absolute: 0.1 10*3/uL (ref 0.0–0.1)
Basophils Relative: 1 %
EOS PCT: 0 %
Eosinophils Absolute: 0 10*3/uL (ref 0.0–0.7)
HCT: 34.3 % — ABNORMAL LOW (ref 36.0–46.0)
Hemoglobin: 12 g/dL (ref 12.0–15.0)
LYMPHS ABS: 2.2 10*3/uL (ref 0.7–4.0)
LYMPHS PCT: 23 %
MCH: 30 pg (ref 26.0–34.0)
MCHC: 35 g/dL (ref 30.0–36.0)
MCV: 85.8 fL (ref 78.0–100.0)
MONO ABS: 0.5 10*3/uL (ref 0.1–1.0)
Monocytes Relative: 5 %
Neutro Abs: 7.1 10*3/uL (ref 1.7–7.7)
Neutrophils Relative %: 71 %
PLATELETS: 307 10*3/uL (ref 150–400)
RBC: 4 MIL/uL (ref 3.87–5.11)
RDW: 11.8 % (ref 11.5–15.5)
WBC: 9.9 10*3/uL (ref 4.0–10.5)

## 2015-12-16 LAB — I-STAT BETA HCG BLOOD, ED (MC, WL, AP ONLY): I-stat hCG, quantitative: <5 m[IU]/mL (ref <5)

## 2015-12-16 MED ORDER — SODIUM CHLORIDE 0.9 % IV SOLN
1000.0000 mL | Freq: Once | INTRAVENOUS | Status: AC
  Administered 2015-12-16: 1000 mL via INTRAVENOUS

## 2015-12-16 MED ORDER — SODIUM CHLORIDE 0.9 % IV SOLN
1000.0000 mL | INTRAVENOUS | Status: DC
  Administered 2015-12-16: 1000 mL via INTRAVENOUS

## 2015-12-16 MED ORDER — ONDANSETRON HCL 4 MG PO TABS: 4.0000 mg | ORAL_TABLET | Freq: Four times a day (QID) | ORAL | 0 refills | Status: DC | PRN

## 2015-12-16 MED ORDER — METOCLOPRAMIDE HCL 5 MG/ML IJ SOLN
10.0000 mg | Freq: Once | INTRAMUSCULAR | Status: AC
  Administered 2015-12-16: 10 mg via INTRAVENOUS
  Filled 2015-12-16: qty 2

## 2015-12-16 MED ORDER — ONDANSETRON HCL 4 MG/2ML IJ SOLN
4.0000 mg | Freq: Once | INTRAMUSCULAR | Status: AC
  Administered 2015-12-16: 4 mg via INTRAVENOUS
  Filled 2015-12-16: qty 2

## 2015-12-16 MED ORDER — DIPHENHYDRAMINE HCL 50 MG/ML IJ SOLN
25.0000 mg | Freq: Once | INTRAMUSCULAR | Status: AC
  Administered 2015-12-16: 25 mg via INTRAVENOUS
  Filled 2015-12-16: qty 1

## 2015-12-16 NOTE — ED Triage Notes (Signed)
Nausea, vomiting, and diarrhea that started around 8 pm tonight per pt.  Having chills.

## 2015-12-16 NOTE — Discharge Instructions (Signed)
Take loperamide (Imodium A-D) as needed for diarrhea. ?

## 2015-12-16 NOTE — ED Provider Notes (Signed)
[facility] DEPT Provider Note   CSN: [id number] Arrival date & time: 12/16/15  0210  First Provider Contact:  First MD Initiated Contact with Patient 12/16/15 [phone]        History   Chief Complaint Chief Complaint  Patient presents with  . Emesis    HPI Adrienne Church is a 30 y.o. female.She started having nausea, vomiting, diarrhea after eating dinner. No one else at home has gotten sick, but no one 8 which she ate. She's had some chills but no fever or sweats. There is some epigastric cramping which he rates at 6/10. She has vomited about 6 times and had about 4 loose bowel movements. She continues to have nausea but does not have any sense of rectal urgency.  The history is provided by the patient.  Emesis      Past Medical History:  Diagnosis Date  . MVP (mitral valve prolapse)   . Renal disorder    kidney stones    Patient Active Problem List   Diagnosis Date Noted  . Infertility counseling 11/22/2015    Past Surgical History:  Procedure Laterality Date  . BREAST REDUCTION SURGERY    . TENDON REPAIR      OB History    Gravida Para Term Preterm AB Living   SAB TAB Ectopic Multiple Live Births           1       Home Medications    Prior to Admission medications   Medication Sig Start Date End Date Taking? Authorizing Provider  acetaminophen (TYLENOL) 500 MG tablet Take 500 mg by mouth every 6 (six) hours as needed for mild pain, moderate pain or headache.    Historical Provider, MD  fexofenadine (ALLEGRA) 60 MG tablet Take 60 mg by mouth.    Historical Provider, MD  fluticasone Aleda Grana) 50 MCG/ACT nasal spray  09/16/15   Historical Provider, MD    Family History Family History  Problem Relation Age of Onset  . Kidney disease Mother   . Mental illness Mother   . Thyroid disease Mother   . Mitral valve prolapse Father     Social History Social History  Substance Use Topics  . Smoking status: Never Smoker  . Smokeless tobacco:  Never Used  . Alcohol use Yes     Comment: occ     Allergies   Azithromycin   Review of Systems Review of Systems  Gastrointestinal: Positive for vomiting.  All other systems reviewed and are negative.    Physical Exam Updated Vital Signs BP 123/94 (BP Location: Left Arm)   Pulse 74   Temp 97.4 F (36.3 C) (Oral)   Resp 18   Ht  (1.651 m)   Wt 150 lb (68 kg)   LMP 12/09/2015 (Approximate)   SpO2 100%   BMI 24.96 kg/m   Physical Exam  Nursing note and vitals reviewed.  30 year old female, resting comfortably and in no acute distress. Vital signs are significant for diastolic hypertension. Oxygen saturation is 100%, which is normal. Head is normocephalic and atraumatic. PERRLA, EOMI. Oropharynx is clear. Neck is nontender and supple without adenopathy or JVD. Back is nontender and there is no CVA tenderness. Lungs are clear without rales, wheezes, or rhonchi. Chest is nontender. Heart has regular rate and rhythm without murmur. Abdomen is soft, flat, with mild epigastric tenderness. There is no rebound or guarding. There are no masses or hepatosplenomegaly  and peristalsis is normoactive. Extremities have no cyanosis or edema, full range of motion is present. Skin is warm and dry without rash. Neurologic: Mental status is normal, cranial nerves are intact, there are no motor or sensory deficits.  ED Treatments / Results  Labs (all labs ordered are listed, but only abnormal results are displayed) Labs Reviewed  COMPREHENSIVE METABOLIC PANEL - Abnormal; Notable for the following:       Result Value   Potassium 3.3 (*)    Glucose, Bld 125 (*)    Total Bilirubin 0.2 (*)    All other components within normal limits  CBC WITH DIFFERENTIAL/PLATELET - Abnormal; Notable for the following:    HCT 34.3 (*)    All other components within normal limits  I-STAT BETA HCG BLOOD, ED ([facility], [facility], [facility] ONLY)    Procedures Procedures (including critical care  time)  Medications Ordered in ED Medications  0.9 %  sodium chloride infusion (1,000 mLs Intravenous New Bag/Given 12/16/15 0239)    Followed by  0.9 %  sodium chloride infusion (1,000 mLs Intravenous New Bag/Given 12/16/15 0239)  metoCLOPramide (REGLAN) injection 10 mg (not administered)  diphenhydrAMINE (BENADRYL) injection 25 mg (not administered)  ondansetron (ZOFRAN) injection 4 mg (4 mg Intravenous Given 12/16/15 0240)     Initial Impression / Assessment and Plan / ED Course  I have reviewed the triage vital signs and the nursing notes.  Pertinent labs that were available during my care of the patient were reviewed by me and considered in my medical decision making (see chart for details).  Clinical Course    Nausea, vomiting, diarrhea. Food poisoning versus viral gastroenteritis. Screening labs are obtained and she will be given IV fluids as well as IV ondansetron. Old records are reviewed, and she has no relevant past visits.  She feels much better after above noted treatment, but still has residual nausea. She is given a dose of metoclopramide and discharged with a take-home pack of ondansetron.  Final Clinical Impressions(s) / ED Diagnoses   Final diagnoses:  Nausea vomiting and diarrhea    New Prescriptions New Prescriptions   ONDANSETRON (ZOFRAN) 4 MG TABLET    Take 1 tablet (4 mg total) by mouth every 6 (six) hours as needed.     Dione Boozeavid Aundrey Elahi, MD 12/16/15 [phone]

## 2015-12-23 MED FILL — Ondansetron HCl Tab 4 MG: ORAL | Qty: 4 | Status: AC

## 2015-12-30 LAB — PROGESTERONE: Progesterone: 12 ng/mL

## 2016-01-13 ENCOUNTER — Telehealth: Payer: Self-pay | Admitting: *Deleted

## 2016-01-13 ENCOUNTER — Ambulatory Visit (INDEPENDENT_AMBULATORY_CARE_PROVIDER_SITE_OTHER): Payer: Managed Care, Other (non HMO) | Admitting: Obstetrics and Gynecology

## 2016-01-13 ENCOUNTER — Encounter: Payer: Self-pay | Admitting: Obstetrics and Gynecology

## 2016-01-13 VITALS — BP 128/90 | HR 60 | Ht 65.0 in | Wt 149.2 lb

## 2016-01-13 DIAGNOSIS — Z3169 Encounter for other general counseling and advice on procreation: Secondary | ICD-10-CM | POA: Diagnosis not present

## 2016-01-13 NOTE — Progress Notes (Signed)
   [facility] Clinic Visit  01/13/2016           Patient name: Vonna DraftsRachel J Schirtzinger MRN [id number]  Date of birth: 1985-08-03  CC & HPI:  Vonna DraftsRachel J Clipper is a 30 y.o. female presenting today for follow up concerning fertility counseling. On 12/29/15 pt's serum progesterone was 12.  Partner has not had a semen analysis done. Pt has no other complaints or symptoms at this time.   ROS:  ROS Otherwise negative negative for acute change except as noted in the HPI.  Pertinent History Reviewed:   Reviewed: Medical         Past Medical History:  Diagnosis Date  . MVP (mitral valve prolapse)   . Renal disorder    kidney stones                              Surgical Hx:    Past Surgical History:  Procedure Laterality Date  . BREAST REDUCTION SURGERY    . TENDON REPAIR     Medications: Reviewed & Updated - see associated section                       Current Outpatient Prescriptions:  .  acetaminophen (TYLENOL) 500 MG tablet, Take 500 mg by mouth every 6 (six) hours as needed for mild pain, moderate pain or headache., Disp: , Rfl:  .  fexofenadine (ALLEGRA) 60 MG tablet, Take 60 mg by mouth., Disp: , Rfl:  .  fluticasone (FLONASE) 50 MCG/ACT nasal spray, , Disp: , Rfl:  .  ondansetron (ZOFRAN) 4 MG tablet, Take 1 tablet (4 mg total) by mouth every 6 (six) hours as needed., Disp: 4 tablet, Rfl: 0   Social History: Reviewed -  reports that she has never smoked. She has never used smokeless tobacco.  Objective Findings:  Vitals: Blood pressure 128/90, pulse 60, height 5\' 5"  (1.651 m), weight 149 lb 3.2 oz (67.7 kg), last menstrual period 01/08/2016.  Physical Examination: General appearance - alert, well appearing, and in no distress Mental status - alert, oriented to person, place, and time Pelvic - examination not indicated   The provider spent over 25 minutes with the visit , including previsit review, and documentation,with >than 50% spent in counseling and coordination of  care.  Assessment & Plan:   A:  1. Secondary fertility counseling 2. Plan semen analysis has been referred to [facility], given phone numbers and contact made for patient P: Specimen container given to patient 1. F/u by phone with semen analysis results Will allow patient to continue with planned intercourse around ovulation using ovulation guidance for now and proceed with further testing if not pregnant in 3 months 2. Tubal dye study in December 2017   By signing my name below, I, Freida BusmanDiana Omoyeni, attest that this documentation has been prepared under the direction and in the presence of Tilda BurrowJohn V Glorie Dowlen, MD . Electronically Signed: Freida Busmaniana Omoyeni, Scribe. 01/13/2016. 10:42 AM. I personally performed the services described in this documentation, which was SCRIBED in my presence. The recorded information has been reviewed and considered accurate. It has been edited as necessary during review. Tilda BurrowFERGUSON,Cam Harnden V, MD

## 2016-04-05 ENCOUNTER — Ambulatory Visit: Payer: Managed Care, Other (non HMO) | Admitting: Obstetrics and Gynecology

## 2016-05-07 ENCOUNTER — Encounter (HOSPITAL_COMMUNITY): Payer: Self-pay

## 2016-05-07 ENCOUNTER — Emergency Department (HOSPITAL_COMMUNITY)
Admission: EM | Admit: 2016-05-07 | Discharge: 2016-05-07 | Disposition: A | Discharge status: Home / Self Care | Payer: Managed Care, Other (non HMO) | Attending: Emergency Medicine | Admitting: Emergency Medicine

## 2016-05-07 DIAGNOSIS — R1010 Upper abdominal pain, unspecified: Secondary | ICD-10-CM

## 2016-05-07 DIAGNOSIS — R1013 Epigastric pain: Secondary | ICD-10-CM | POA: Insufficient documentation

## 2016-05-07 LAB — URINALYSIS, ROUTINE W REFLEX MICROSCOPIC
BILIRUBIN URINE: NEGATIVE
Glucose, UA: NEGATIVE mg/dL
HGB URINE DIPSTICK: NEGATIVE
KETONES UR: NEGATIVE mg/dL
Leukocytes, UA: NEGATIVE
NITRITE: NEGATIVE
PH: 6 (ref 5.0–8.0)
Protein, ur: NEGATIVE mg/dL
Specific Gravity, Urine: 1.006 (ref 1.005–1.030)

## 2016-05-07 LAB — COMPREHENSIVE METABOLIC PANEL
ALBUMIN: 4.5 g/dL (ref 3.5–5.0)
ALK PHOS: 46 U/L (ref 38–126)
ALT: 14 U/L (ref 14–54)
AST: 16 U/L (ref 15–41)
Anion gap: 8 (ref 5–15)
BILIRUBIN TOTAL: 0.5 mg/dL (ref 0.3–1.2)
BUN: 10 mg/dL (ref 6–20)
CALCIUM: 9.2 mg/dL (ref 8.9–10.3)
CO2: 26 mmol/L (ref 22–32)
Chloride: 103 mmol/L (ref 101–111)
Creatinine, Ser: 0.74 mg/dL (ref 0.44–1.00)
GFR calc Af Amer: >60 mL/min (ref >60)
GFR calc non Af Amer: >60 mL/min (ref >60)
GLUCOSE: 97 mg/dL (ref 65–99)
Potassium: 3.7 mmol/L (ref 3.5–5.1)
Sodium: 137 mmol/L (ref 135–145)
TOTAL PROTEIN: 8.1 g/dL (ref 6.5–8.1)

## 2016-05-07 LAB — CBC
HCT: 39.5 % (ref 36.0–46.0)
Hemoglobin: 13.7 g/dL (ref 12.0–15.0)
MCH: 30.9 pg (ref 26.0–34.0)
MCHC: 34.7 g/dL (ref 30.0–36.0)
MCV: 89.2 fL (ref 78.0–100.0)
Platelets: 300 10*3/uL (ref 150–400)
RBC: 4.43 MIL/uL (ref 3.87–5.11)
RDW: 12.3 % (ref 11.5–15.5)
WBC: 6.2 10*3/uL (ref 4.0–10.5)

## 2016-05-07 LAB — PREGNANCY, URINE: PREG TEST UR: NEGATIVE

## 2016-05-07 LAB — LIPASE, BLOOD: Lipase: 36 U/L (ref 11–51)

## 2016-05-07 MED ORDER — PANTOPRAZOLE SODIUM 20 MG PO TBEC: 20.0000 mg | DELAYED_RELEASE_TABLET | Freq: Every day | ORAL | 0 refills | Status: DC

## 2016-05-07 MED ORDER — KETOROLAC TROMETHAMINE 30 MG/ML IJ SOLN
30.0000 mg | Freq: Once | INTRAMUSCULAR | Status: AC
  Administered 2016-05-07: 30 mg via INTRAVENOUS
  Filled 2016-05-07: qty 1

## 2016-05-07 MED ORDER — PANTOPRAZOLE SODIUM 40 MG IV SOLR
40.0000 mg | Freq: Once | INTRAVENOUS | Status: AC
  Administered 2016-05-07: 40 mg via INTRAVENOUS
  Filled 2016-05-07: qty 40

## 2016-05-07 MED ORDER — ONDANSETRON 4 MG PO TBDP: ORAL_TABLET | ORAL | 0 refills | Status: DC

## 2016-05-07 MED ORDER — SODIUM CHLORIDE 0.9 % IV BOLUS (SEPSIS)
1000.0000 mL | Freq: Once | INTRAVENOUS | Status: AC
  Administered 2016-05-07: 1000 mL via INTRAVENOUS

## 2016-05-07 MED ORDER — TRAMADOL HCL 50 MG PO TABS: 50.0000 mg | ORAL_TABLET | Freq: Four times a day (QID) | ORAL | 0 refills | Status: DC | PRN

## 2016-05-07 NOTE — ED Notes (Signed)
Pt states she is not feeling much better at this time.

## 2016-05-07 NOTE — ED Provider Notes (Signed)
[facility] Provider Note   CSN: [id number] Arrival date & time: 05/07/16  1421     History   Chief Complaint Chief Complaint  Patient presents with  . Abdominal Pain    HPI Adrienne Church is a 30 y.o. female.  Patient complains of epigastric abdominal pain. Patient states that she had a dream that she was in labor and woke up with abdominal pain   The history is provided by the patient. No language interpreter was used.  Abdominal Pain   This is a new problem. The current episode started 12 to 24 hours ago. The problem occurs constantly. The problem has not changed since onset.The pain is associated with an unknown factor. The pain is located in the epigastric region. The pain is at a severity of 4/10. The pain is moderate. Pertinent negatives include anorexia, diarrhea, frequency, hematuria and headaches. Nothing aggravates the symptoms.    Past Medical History:  Diagnosis Date  . MVP (mitral valve prolapse)   . Renal disorder    kidney stones    Patient Active Problem List   Diagnosis Date Noted  . Infertility counseling 11/22/2015    Past Surgical History:  Procedure Laterality Date  . BREAST REDUCTION SURGERY    . TENDON REPAIR      OB History    Gravida Para Term Preterm AB Living   1 1 1     1    SAB TAB Ectopic Multiple Live Births           1       Home Medications    Prior to Admission medications   Medication Sig Start Date End Date Taking? Authorizing Provider  acetaminophen (TYLENOL) 500 MG tablet Take 500 mg by mouth every 6 (six) hours as needed for mild pain, moderate pain or headache.   Yes Historical Provider, MD  ALPRAZolam Prudy Feeler(XANAX) 0.5 MG tablet Take 0.5 mg by mouth daily.   Yes Historical Provider, MD  fluticasone (FLONASE) 50 MCG/ACT nasal spray  09/16/15  Yes Historical Provider, MD  ondansetron (ZOFRAN) 4 MG tablet Take 4 mg by mouth every 8 (eight) hours as needed for nausea or vomiting.   Yes Historical Provider, MD    Prenatal Multivit-Min-Fe-FA (PRE-NATAL PO) Take 1 tablet by mouth daily.   Yes Historical Provider, MD  ondansetron (ZOFRAN ODT) 4 MG disintegrating tablet 4mg  ODT q4 hours prn nausea/vomit 05/07/16   Bethann BerkshireJoseph Jerami Tammen, MD  pantoprazole (PROTONIX) 20 MG tablet Take 1 tablet (20 mg total) by mouth daily. 05/07/16   Bethann BerkshireJoseph Marquavius Scaife, MD  traMADol (ULTRAM) 50 MG tablet Take 1 tablet (50 mg total) by mouth every 6 (six) hours as needed. 05/07/16   Bethann BerkshireJoseph Blayke Pinera, MD    Family History Family History  Problem Relation Age of Onset  . Kidney disease Mother   . Mental illness Mother   . Thyroid disease Mother   . Mitral valve prolapse Father     Social History Social History  Substance Use Topics  . Smoking status: Never Smoker  . Smokeless tobacco: Never Used  . Alcohol use Yes     Comment: occ     Allergies   Azithromycin   Review of Systems Review of Systems  Constitutional: Negative for appetite change and fatigue.  HENT: Negative for congestion, ear discharge and sinus pressure.   Eyes: Negative for discharge.  Respiratory: Negative for cough.   Cardiovascular: Negative for chest pain.  Gastrointestinal: Positive for abdominal pain. Negative for anorexia and diarrhea.  Genitourinary: Negative for frequency and hematuria.  Musculoskeletal: Negative for back pain.  Skin: Negative for rash.  Neurological: Negative for seizures and headaches.  Psychiatric/Behavioral: Negative for hallucinations.     Physical Exam Updated Vital Signs BP 119/69 (BP Location: Right Arm)   Pulse 82   Temp 98.1 F (36.7 C) (Oral)   Resp 12   Ht 5\' 5"  (1.651 m)   Wt 150 lb (68 kg)   LMP 04/28/2016   SpO2 100%   BMI 24.96 kg/m   Physical Exam  Constitutional: She is oriented to person, place, and time. She appears well-developed.  HENT:  Head: Normocephalic.  Eyes: Conjunctivae and EOM are normal. No scleral icterus.  Neck: Neck supple. No thyromegaly present.  Cardiovascular: Normal rate  and regular rhythm.  Exam reveals no gallop and no friction rub.   No murmur heard. Pulmonary/Chest: No stridor. She has no wheezes. She has no rales. She exhibits no tenderness.  Abdominal: She exhibits no distension. There is tenderness. There is no rebound.  Minor tenderness to epigastric area  Musculoskeletal: Normal range of motion. She exhibits no edema.  Lymphadenopathy:    She has no cervical adenopathy.  Neurological: She is oriented to person, place, and time. She exhibits normal muscle tone. Coordination normal.  Skin: No rash noted. No erythema.  Psychiatric: She has a normal mood and affect. Her behavior is normal.     ED Treatments / Results  Labs (all labs ordered are listed, but only abnormal results are displayed) Labs Reviewed  URINALYSIS, ROUTINE W REFLEX MICROSCOPIC - Abnormal; Notable for the following:       Result Value   Color, Urine STRAW (*)    All other components within normal limits  LIPASE, BLOOD  COMPREHENSIVE METABOLIC PANEL  CBC  PREGNANCY, URINE    EKG  EKG Interpretation None       Radiology No results found.  Procedures Procedures (including critical care time)  Medications Ordered in ED Medications  sodium chloride 0.9 % bolus 1,000 mL (1,000 mLs Intravenous New Bag/Given 05/07/16 1626)  pantoprazole (PROTONIX) injection 40 mg (40 mg Intravenous Given 05/07/16 1626)  ketorolac (TORADOL) 30 MG/ML injection 30 mg (30 mg Intravenous Given 05/07/16 1626)     Initial Impression / Assessment and Plan / ED Course  I have reviewed the triage vital signs and the nursing notes.  Pertinent labs & imaging results that were available during my care of the patient were reviewed by me and considered in my medical decision making (see chart for details).  Clinical Course     Patient with abdominal pain. Labs unremarkable. Symptoms improved prior to discharge. I will treat the patient for gastritis with proton  pump inhibitor.  Patient will  follow-up with her PCP  Final Clinical Impressions(s) / ED Diagnoses   Final diagnoses:  Pain of upper abdomen    New Prescriptions New Prescriptions   ONDANSETRON (ZOFRAN ODT) 4 MG DISINTEGRATING TABLET    4mg  ODT q4 hours prn nausea/vomit   PANTOPRAZOLE (PROTONIX) 20 MG TABLET    Take 1 tablet (20 mg total) by mouth daily.   TRAMADOL (ULTRAM) 50 MG TABLET    Take 1 tablet (50 mg total) by mouth every 6 (six) hours as needed.     Bethann BerkshireJoseph Braelynne Garinger, MD 05/07/16 [phone]

## 2016-05-07 NOTE — ED Triage Notes (Signed)
Patient reports of having panic attack and dreamed she was in labor. States she has then since had abdominal pain. Denies n/v has had several episodes of diarrhea. Lack of appetite.

## 2016-05-07 NOTE — Discharge Instructions (Signed)
Follow-up with your family doctor this week for recheck. Return to emergency department if your pain becomes severe

## 2016-05-08 ENCOUNTER — Emergency Department (HOSPITAL_COMMUNITY): Payer: BLUE CROSS/BLUE SHIELD

## 2016-05-08 ENCOUNTER — Encounter (HOSPITAL_COMMUNITY): Payer: Self-pay

## 2016-05-08 ENCOUNTER — Emergency Department (HOSPITAL_COMMUNITY)
Admission: EM | Admit: 2016-05-08 | Discharge: 2016-05-08 | Disposition: A | Discharge status: Home / Self Care | Payer: BLUE CROSS/BLUE SHIELD | Attending: Emergency Medicine | Admitting: Emergency Medicine

## 2016-05-08 DIAGNOSIS — Z79899 Other long term (current) drug therapy: Secondary | ICD-10-CM | POA: Insufficient documentation

## 2016-05-08 DIAGNOSIS — R1011 Right upper quadrant pain: Secondary | ICD-10-CM | POA: Diagnosis present

## 2016-05-08 DIAGNOSIS — K2900 Acute gastritis without bleeding: Secondary | ICD-10-CM | POA: Insufficient documentation

## 2016-05-08 DIAGNOSIS — R109 Unspecified abdominal pain: Secondary | ICD-10-CM | POA: Diagnosis not present

## 2016-05-08 MED ORDER — SUCRALFATE 1 GM/10ML PO SUSP
1.0000 g | Freq: Once | ORAL | Status: AC
  Administered 2016-05-08: 1 g via ORAL
  Filled 2016-05-08: qty 10

## 2016-05-08 MED ORDER — IOPAMIDOL (ISOVUE-300) INJECTION 61%
INTRAVENOUS | Status: AC
  Administered 2016-05-08: 100 mL
  Filled 2016-05-08: qty 100

## 2016-05-08 MED ORDER — RANITIDINE HCL 150 MG/10ML PO SYRP
300.0000 mg | ORAL_SOLUTION | Freq: Once | ORAL | Status: DC
  Filled 2016-05-08: qty 20

## 2016-05-08 MED ORDER — MORPHINE SULFATE (PF) 4 MG/ML IV SOLN
4.0000 mg | Freq: Once | INTRAVENOUS | Status: AC
  Administered 2016-05-08: 2 mg via INTRAVENOUS
  Filled 2016-05-08: qty 1

## 2016-05-08 MED ORDER — ONDANSETRON HCL 4 MG/2ML IJ SOLN
4.0000 mg | Freq: Once | INTRAMUSCULAR | Status: AC
  Administered 2016-05-08: 4 mg via INTRAVENOUS
  Filled 2016-05-08: qty 2

## 2016-05-08 MED ORDER — DICYCLOMINE HCL 10 MG PO CAPS
20.0000 mg | ORAL_CAPSULE | Freq: Once | ORAL | Status: AC
  Administered 2016-05-08: 20 mg via ORAL
  Filled 2016-05-08: qty 2

## 2016-05-08 MED ORDER — METOCLOPRAMIDE HCL 10 MG PO TABS
10.0000 mg | ORAL_TABLET | Freq: Once | ORAL | Status: AC
  Administered 2016-05-08: 10 mg via ORAL
  Filled 2016-05-08: qty 1

## 2016-05-08 NOTE — ED Provider Notes (Signed)
[facility]-EMERGENCY DEPT Provider Note     By signing my name below, I, Earmon Phoenix, attest that this documentation has been prepared under the direction and in the presence of Tomasita Crumble, MD. Electronically Signed: Earmon Phoenix, ED Scribe. 05/08/16. 2:35 AM.    History   Chief Complaint Chief Complaint  Patient presents with  . Abdominal Pain    The history is provided by the patient and medical records. No language interpreter was used.    HPI Comments:  Adrienne Church is a 31 y.o. female with PMHx MVP and kidney stones brought in by EMS who presents to the Emergency Department complaining of worsening RUQ abdominal pain that started two days ago. She reports associated nausea, vomiting (one episode since leaving [facility]) and diarrhea. Pt was seen at [facility] yesterday approximately 10 hours ago and was treated for gastritis with Zofran, Protonix and Tramadol. Pt states she was feeling better but then she ate some potatoes this evening and drank some water which made her symptoms return. Eating and drinking increase her symptoms. She denies alleviating factors. She denies any known sick contacts. She denies dysuria, hematuria, fever, chills, abnormal periods, vaginal bleeding or discharge.   Past Medical History:  Diagnosis Date  . MVP (mitral valve prolapse)   . Renal disorder    kidney stones    Patient Active Problem List   Diagnosis Date Noted  . Infertility counseling 11/22/2015    Past Surgical History:  Procedure Laterality Date  . BREAST REDUCTION SURGERY    . TENDON REPAIR      OB History    Gravida Para Term Preterm AB Living   1 1 1     1    SAB TAB Ectopic Multiple Live Births           1       Home Medications    Prior to Admission medications   Medication Sig Start Date End Date Taking? Authorizing Provider  Prenatal Multivit-Min-Fe-FA (PRE-NATAL PO) Take 1 tablet by mouth daily.   Yes Historical Provider, MD  ondansetron (ZOFRAN ODT) 4 MG  disintegrating tablet 4mg  ODT q4 hours prn nausea/vomit Patient not taking: Reported on 05/08/2016 05/07/16   Bethann Berkshire, MD  pantoprazole (PROTONIX) 20 MG tablet Take 1 tablet (20 mg total) by mouth daily. Patient not taking: Reported on 05/08/2016 05/07/16   Bethann Berkshire, MD  traMADol (ULTRAM) 50 MG tablet Take 1 tablet (50 mg total) by mouth every 6 (six) hours as needed. Patient not taking: Reported on 05/08/2016 05/07/16   Bethann Berkshire, MD    Family History Family History  Problem Relation Age of Onset  . Kidney disease Mother   . Mental illness Mother   . Thyroid disease Mother   . Mitral valve prolapse Father     Social History Social History  Substance Use Topics  . Smoking status: Never Smoker  . Smokeless tobacco: Never Used  . Alcohol use Yes     Comment: occ     Allergies   Azithromycin   Review of Systems Review of Systems  A complete 10 system review of systems was obtained and all systems are negative except as noted in the HPI and PMH.    Physical Exam Updated Vital Signs BP 134/86   Pulse 100   Temp 98.4 F (36.9 C) (Oral)   Resp 17   Ht 5\' 5"  (1.651 m)   Wt 150 lb (68 kg)   LMP 04/28/2016   SpO2 100%  BMI 24.96 kg/m   Physical Exam  Constitutional: She is oriented to person, place, and time. She appears well-developed and well-nourished. No distress.  HENT:  Head: Normocephalic and atraumatic.  Nose: Nose normal.  Mouth/Throat: Oropharynx is clear and moist. No oropharyngeal exudate.  Eyes: Conjunctivae and EOM are normal. Pupils are equal, round, and reactive to light. No scleral icterus.  Neck: Normal range of motion. Neck supple. No JVD present. No tracheal deviation present. No thyromegaly present.  Cardiovascular: Normal rate, regular rhythm and normal heart sounds.  Exam reveals no gallop and no friction rub.   No murmur heard. Pulmonary/Chest: Effort normal and breath sounds normal. No respiratory distress. She has no wheezes. She  exhibits no tenderness.  Abdominal: Soft. Bowel sounds are normal. She exhibits no distension and no mass. There is tenderness. There is no rebound and no guarding.  RUQ tenderness to palpation  Musculoskeletal: Normal range of motion. She exhibits no edema or tenderness.  Lymphadenopathy:    She has no cervical adenopathy.  Neurological: She is alert and oriented to person, place, and time. No cranial nerve deficit. She exhibits normal muscle tone.  Skin: Skin is warm and dry. No rash noted. No erythema. No pallor.  Psychiatric: Her mood appears anxious.  Tearful and anxious  Nursing note and vitals reviewed.    ED Treatments / Results  DIAGNOSTIC STUDIES: Oxygen Saturation is 100% on RA, normal by my interpretation.   COORDINATION OF CARE: 12:46 AM- Will order pain and nausea medication. Will ultrasound gallbladder. Pt verbalizes understanding and agrees to plan.  Medications  ranitidine (ZANTAC) 150 MG/10ML syrup 300 mg (300 mg Oral Refused 05/08/16 0225)  morphine 4 MG/ML injection 4 mg (2 mg Intravenous Given 05/08/16 0104)  ondansetron (ZOFRAN) injection 4 mg (4 mg Intravenous Given 05/08/16 0059)  dicyclomine (BENTYL) capsule 20 mg (20 mg Oral Given 05/08/16 0101)  iopamidol (ISOVUE-300) 61 % injection (100 mLs  Contrast Given 05/08/16 0214)  sucralfate (CARAFATE) 1 GM/10ML suspension 1 g (1 g Oral Given 05/08/16 0307)  metoCLOPramide (REGLAN) tablet 10 mg (10 mg Oral Given 05/08/16 0225)    Labs (all labs ordered are listed, but only abnormal results are displayed) Labs Reviewed - No data to display  EKG  EKG Interpretation None       Radiology Ct Abdomen Pelvis W Contrast  Result Date: 05/08/2016 CLINICAL DATA:  Woke up with severe RIGHT abdominal pain. Vomiting for 2 days. History of kidney stones. EXAM: CT ABDOMEN AND PELVIS WITH CONTRAST TECHNIQUE: Multidetector CT imaging of the abdomen and pelvis was performed using the standard protocol following bolus administration of  intravenous contrast. CONTRAST:  100mL ISOVUE-300 IOPAMIDOL (ISOVUE-300) INJECTION 61% COMPARISON:  Abdominal ultrasound May 08, 2016 at 0121 hours and CT abdomen and pelvis October 08, 2010 fell FINDINGS: LOWER CHEST: Lung bases are clear. Included heart size is normal. No pericardial effusion. HEPATOBILIARY: Liver and gallbladder are normal. PANCREAS: Normal. SPLEEN: Normal. ADRENALS/URINARY TRACT: Kidneys are orthotopic, demonstrating symmetric enhancement. No nephrolithiasis, hydronephrosis or solid renal masses. The unopacified ureters are normal in course and caliber. Delayed imaging through the kidneys demonstrates symmetric prompt contrast excretion within the proximal urinary collecting system. Urinary bladder is partially distended and unremarkable. Normal adrenal glands. STOMACH/BOWEL: The stomach, small and large bowel are normal in course and caliber without inflammatory changes. Small amount of small bowel feces compatible chronic stasis. Normal appendix. VASCULAR/LYMPHATIC: Aortoiliac vessels are normal in course and caliber. No lymphadenopathy by CT size criteria. Tiny mesenteric lymph nodes  are likely reactive. Mild "misty" mesentery, decreased. REPRODUCTIVE: Normal.  LEFT dominant follicle. OTHER: Trace amount of free fluid in the pelvis is likely physiologic. No intraperitoneal free air or focal fluid collections. MUSCULOSKELETAL: Nonacute. Small L4-5 broad-based disc bulge, small L5-S1 broad-based disc osteophyte complex. IMPRESSION: No acute intra-abdominal or pelvic process. Electronically Signed   By: Awilda Metro M.D.   On: 05/08/2016 02:44   US Abdomen Limited Ruq  Result Date: 05/08/2016 CLINICAL DATA:  Abdominal pain for 1 day. EXAM: US ABDOMEN LIMITED - RIGHT UPPER QUADRANT COMPARISON:  None. FINDINGS: Gallbladder: No gallstones or wall thickening visualized. No pericholecystic fluid. Common bile duct: Diameter: 2mm Liver: No focal lesion identified. Within normal limits in  parenchymal echogenicity. IMPRESSION: Normal liver, gallbladder and bile ducts. Electronically Signed   By: Ellery Plunk M.D.   On: 05/08/2016 01:27    Procedures Procedures (including critical care time)  Medications Ordered in ED Medications  ranitidine (ZANTAC) 150 MG/10ML syrup 300 mg (300 mg Oral Refused 05/08/16 0225)  morphine 4 MG/ML injection 4 mg (2 mg Intravenous Given 05/08/16 0104)  ondansetron (ZOFRAN) injection 4 mg (4 mg Intravenous Given 05/08/16 0059)  dicyclomine (BENTYL) capsule 20 mg (20 mg Oral Given 05/08/16 0101)  iopamidol (ISOVUE-300) 61 % injection (100 mLs  Contrast Given 05/08/16 0214)  sucralfate (CARAFATE) 1 GM/10ML suspension 1 g (1 g Oral Given 05/08/16 0307)  metoCLOPramide (REGLAN) tablet 10 mg (10 mg Oral Given 05/08/16 0225)     Initial Impression / Assessment and Plan / ED Course  I have reviewed the triage vital signs and the nursing notes.  Pertinent labs & imaging results that were available during my care of the patient were reviewed by me and considered in my medical decision making (see chart for details).  Clinical Course    Patient presents to the ED for worsening abd pain and vomiting.  It is worse in the midepigastric and RUQ.  She was given morphine and zofran which helped her symptoms, but she still has mild pain.  RUQ [place] is negative.  Will obtain CT for further evaluation.  This may be gastritis as she states it is no going up her throat for the first time. She was ordered ranitidine, sucralfate and reglan.  Will continue to reevaluate.   3:12 AM Upon repeat evaluation, patient feels better and now able to tolerate PO.  CT is negative.  This is possibly gastritis.  Plan for zantac at home as needed and PCP fu within 3 days. She appears well and in NAD.  VS remain withi her normal limits and she is safe for DC.    I personally performed the services described in this documentation, which was scribed in my presence. The recorded information has  been reviewed and is accurate.     Final Clinical Impressions(s) / ED Diagnoses   Final diagnoses:  RUQ abdominal pain  Acute gastritis without hemorrhage, unspecified gastritis type    New Prescriptions New Prescriptions   No medications on file     Tomasita Crumble, MD 05/08/16 [phone]

## 2016-05-08 NOTE — ED Triage Notes (Signed)
[facility] and discharged with abdominal pain and had not had any relief. Pt very anxious.

## 2016-05-18 DIAGNOSIS — Z3169 Encounter for other general counseling and advice on procreation: Secondary | ICD-10-CM | POA: Diagnosis not present

## 2016-07-07 DIAGNOSIS — N970 Female infertility associated with anovulation: Secondary | ICD-10-CM | POA: Diagnosis not present

## 2016-09-28 DIAGNOSIS — Z3202 Encounter for pregnancy test, result negative: Secondary | ICD-10-CM | POA: Diagnosis not present

## 2016-11-01 ENCOUNTER — Emergency Department (HOSPITAL_COMMUNITY)
Admission: EM | Admit: 2016-11-01 | Discharge: 2016-11-01 | Disposition: A | Discharge status: Home / Self Care | Payer: BLUE CROSS/BLUE SHIELD | Attending: Emergency Medicine | Admitting: Emergency Medicine

## 2016-11-01 ENCOUNTER — Encounter (HOSPITAL_COMMUNITY): Payer: Self-pay | Admitting: Emergency Medicine

## 2016-11-01 DIAGNOSIS — R1110 Vomiting, unspecified: Secondary | ICD-10-CM | POA: Diagnosis present

## 2016-11-01 DIAGNOSIS — R112 Nausea with vomiting, unspecified: Secondary | ICD-10-CM | POA: Insufficient documentation

## 2016-11-01 DIAGNOSIS — Z79899 Other long term (current) drug therapy: Secondary | ICD-10-CM | POA: Diagnosis not present

## 2016-11-01 LAB — CBC WITH DIFFERENTIAL/PLATELET
BASOS ABS: 0 10*3/uL (ref 0.0–0.1)
BASOS PCT: 0 %
Eosinophils Absolute: 0 10*3/uL (ref 0.0–0.7)
Eosinophils Relative: 0 %
HEMATOCRIT: 34.8 % — AB (ref 36.0–46.0)
HEMOGLOBIN: 12.1 g/dL (ref 12.0–15.0)
Lymphocytes Relative: 12 %
Lymphs Abs: 1.5 10*3/uL (ref 0.7–4.0)
MCH: 30.5 pg (ref 26.0–34.0)
MCHC: 34.8 g/dL (ref 30.0–36.0)
MCV: 87.7 fL (ref 78.0–100.0)
Monocytes Absolute: 0.9 10*3/uL (ref 0.1–1.0)
Monocytes Relative: 7 %
NEUTROS ABS: 10.2 10*3/uL — AB (ref 1.7–7.7)
NEUTROS PCT: 81 %
Platelets: 277 10*3/uL (ref 150–400)
RBC: 3.97 MIL/uL (ref 3.87–5.11)
RDW: 12.3 % (ref 11.5–15.5)
WBC: 12.6 10*3/uL — AB (ref 4.0–10.5)

## 2016-11-01 LAB — COMPREHENSIVE METABOLIC PANEL
ALBUMIN: 4.4 g/dL (ref 3.5–5.0)
ALT: 16 U/L (ref 14–54)
AST: 24 U/L (ref 15–41)
Alkaline Phosphatase: 45 U/L (ref 38–126)
Anion gap: 9 (ref 5–15)
BILIRUBIN TOTAL: 0.8 mg/dL (ref 0.3–1.2)
BUN: 12 mg/dL (ref 6–20)
CO2: 24 mmol/L (ref 22–32)
Calcium: 9.8 mg/dL (ref 8.9–10.3)
Chloride: 106 mmol/L (ref 101–111)
Creatinine, Ser: 1.06 mg/dL — ABNORMAL HIGH (ref 0.44–1.00)
GFR calc Af Amer: >60 mL/min (ref >60)
GFR calc non Af Amer: >60 mL/min (ref >60)
GLUCOSE: 119 mg/dL — AB (ref 65–99)
POTASSIUM: 3.8 mmol/L (ref 3.5–5.1)
Sodium: 139 mmol/L (ref 135–145)
TOTAL PROTEIN: 8 g/dL (ref 6.5–8.1)

## 2016-11-01 LAB — URINALYSIS, ROUTINE W REFLEX MICROSCOPIC
Bilirubin Urine: NEGATIVE
Glucose, UA: NEGATIVE mg/dL
Hgb urine dipstick: NEGATIVE
KETONES UR: NEGATIVE mg/dL
LEUKOCYTES UA: NEGATIVE
Nitrite: NEGATIVE
Protein, ur: 30 mg/dL — AB
Specific Gravity, Urine: 1.005 (ref 1.005–1.030)
pH: 7 (ref 5.0–8.0)

## 2016-11-01 LAB — PREGNANCY, URINE: Preg Test, Ur: NEGATIVE

## 2016-11-01 LAB — LIPASE, BLOOD: Lipase: 29 U/L (ref 11–51)

## 2016-11-01 MED ORDER — ONDANSETRON 4 MG PO TBDP: 4.0000 mg | ORAL_TABLET | Freq: Three times a day (TID) | ORAL | 0 refills | Status: DC | PRN

## 2016-11-01 MED ORDER — SODIUM CHLORIDE 0.9 % IV BOLUS (SEPSIS)
1000.0000 mL | Freq: Once | INTRAVENOUS | Status: AC
  Administered 2016-11-01: 1000 mL via INTRAVENOUS

## 2016-11-01 MED ORDER — OMEPRAZOLE 20 MG PO CPDR: 20.0000 mg | DELAYED_RELEASE_CAPSULE | Freq: Every day | ORAL | 0 refills | Status: DC

## 2016-11-01 MED ORDER — ONDANSETRON HCL 4 MG/2ML IJ SOLN
4.0000 mg | Freq: Once | INTRAMUSCULAR | Status: AC
  Administered 2016-11-01: 4 mg via INTRAVENOUS
  Filled 2016-11-01: qty 2

## 2016-11-01 NOTE — ED Triage Notes (Signed)
Pt c/o emesis that started after a panic attack earlier.

## 2016-11-01 NOTE — ED Notes (Signed)
Pt given ginger ale.

## 2016-11-01 NOTE — Discharge Instructions (Signed)
Take the nausea medications as prescribed. Follow up with your doctor. Return to the ED if you develop new or worsening symptoms.

## 2016-11-01 NOTE — ED Provider Notes (Signed)
[facility]-EMERGENCY DEPT Provider Note   CSN: [id number] Arrival date & time: 11/01/16  0241     History   Chief Complaint Chief Complaint  Patient presents with  . Emesis    HPI Adrienne Church is a 31 y.o. female.  Patient states she had a stressful day and had a "panic attack" upon laying down to try to sleep. Is associated racing heart, palpitations, shortness of breath. Afterward she's had nausea and vomiting and has not been able to keep anything down. States she's vomited about 7 times and feels anytime she tries to lay down she throws up again. Also had one episode of diarrhea. No fever. Does not take alprazolam on a regular basis but takes as needed and took 1 tablet tonight. Denies any dysuria hematuria. Last menstrual cycle one month ago. Denies abdominal pain. Denies chest pain or shortness of breath. States she does not had this happen before with her anxiety attacks. No sick contacts at home.   The history is provided by the patient.  Emesis   Associated symptoms include diarrhea. Pertinent negatives include no abdominal pain, no arthralgias, no cough, no fever and no myalgias.    Past Medical History:  Diagnosis Date  . MVP (mitral valve prolapse)   . Renal disorder    kidney stones    Patient Active Problem List   Diagnosis Date Noted  . Infertility counseling 11/22/2015    Past Surgical History:  Procedure Laterality Date  . BREAST REDUCTION SURGERY    . TENDON REPAIR      OB History    Gravida Para Term Preterm AB Living   1 1 1     1    SAB TAB Ectopic Multiple Live Births           1       Home Medications    Prior to Admission medications   Medication Sig Start Date End Date Taking? Authorizing Provider  acyclovir (ZOVIRAX) 400 MG tablet Take 400 mg by mouth 5 (five) times daily.   Yes [provider]  Prenatal Multivit-Min-Fe-FA (PRE-NATAL PO) Take 1 tablet by mouth daily.   Yes [provider]  ondansetron (ZOFRAN ODT) 4 MG  disintegrating tablet 4mg  ODT q4 hours prn nausea/vomit Patient not taking: Reported on 05/08/2016 05/07/16   Bethann Berkshire, MD  pantoprazole (PROTONIX) 20 MG tablet Take 1 tablet (20 mg total) by mouth daily. Patient not taking: Reported on 05/08/2016 05/07/16   Bethann Berkshire, MD  traMADol (ULTRAM) 50 MG tablet Take 1 tablet (50 mg total) by mouth every 6 (six) hours as needed. Patient not taking: Reported on 05/08/2016 05/07/16   Bethann Berkshire, MD    Family History Family History  Problem Relation Age of Onset  . Kidney disease Mother   . Mental illness Mother   . Thyroid disease Mother   . Mitral valve prolapse Father     Social History Social History  Substance Use Topics  . Smoking status: Never Smoker  . Smokeless tobacco: Never Used  . Alcohol use Yes     Comment: occ     Allergies   Azithromycin   Review of Systems Review of Systems  Constitutional: Positive for activity change and appetite change. Negative for fever.  HENT: Negative for congestion and rhinorrhea.   Respiratory: Negative for cough, chest tightness and shortness of breath.   Cardiovascular: Negative for chest pain.  Gastrointestinal: Positive for diarrhea, nausea and vomiting. Negative for abdominal pain.  Genitourinary: Negative for  dysuria, hematuria, urgency, vaginal bleeding and vaginal discharge.  Musculoskeletal: Negative for arthralgias and myalgias.  Skin: Negative for rash and wound.  Psychiatric/Behavioral: The patient is nervous/anxious.     all other systems are negative except as noted in the HPI and PMH.    Physical Exam Updated Vital Signs BP 137/87 (BP Location: Left Arm)   Pulse 100   Temp 97.8 F (36.6 C) (Oral)   Resp 20   Ht 5\' 5"  (1.651 m)   Wt 68 kg (150 lb)   LMP 10/05/2016   SpO2 100%   BMI 24.96 kg/m   Physical Exam  Constitutional: She is oriented to person, place, and time. She appears well-developed and well-nourished. No distress.  Anxious appearing    HENT:  Head: Normocephalic and atraumatic.  Mouth/Throat: Oropharynx is clear and moist. No oropharyngeal exudate.  Eyes: Conjunctivae and EOM are normal. Pupils are equal, round, and reactive to light.  Neck: Normal range of motion. Neck supple. No JVD present.  No meningismus.  Cardiovascular: Normal rate, regular rhythm, normal heart sounds and intact distal pulses.   No murmur heard. Pulmonary/Chest: Effort normal and breath sounds normal. No respiratory distress. She exhibits no tenderness.  Abdominal: Soft. There is no tenderness. There is no rebound and no guarding.  Musculoskeletal: Normal range of motion. She exhibits no edema or tenderness.  Neurological: She is alert and oriented to person, place, and time. No cranial nerve deficit. She exhibits normal muscle tone. Coordination normal.   5/5 strength throughout. CN 2-12 intact.Equal grip strength.   Skin: Skin is warm.  Psychiatric: She has a normal mood and affect. Her behavior is normal.  Nursing note and vitals reviewed.    ED Treatments / Results  Labs (all labs ordered are listed, but only abnormal results are displayed) Labs Reviewed  URINALYSIS, ROUTINE W REFLEX MICROSCOPIC - Abnormal; Notable for the following:       Result Value   Color, Urine STRAW (*)    Protein, ur 30 (*)    Bacteria, UA RARE (*)    Squamous Epithelial / LPF 0-5 (*)    All other components within normal limits  CBC WITH DIFFERENTIAL/PLATELET - Abnormal; Notable for the following:    WBC 12.6 (*)    HCT 34.8 (*)    Neutro Abs 10.2 (*)    All other components within normal limits  COMPREHENSIVE METABOLIC PANEL - Abnormal; Notable for the following:    Glucose, Bld 119 (*)    Creatinine, Ser 1.06 (*)    All other components within normal limits  PREGNANCY, URINE  LIPASE, BLOOD    EKG  EKG Interpretation None       Radiology No results found.  Procedures Procedures (including critical care time)  Medications Ordered in  ED Medications  sodium chloride 0.9 % bolus 1,000 mL (not administered)  ondansetron (ZOFRAN) injection 4 mg (not administered)     Initial Impression / Assessment and Plan / ED Course  I have reviewed the triage vital signs and the nursing notes.  Pertinent labs & imaging results that were available during my care of the patient were reviewed by me and considered in my medical decision making (see chart for details).     Patient with nausea and vomiting after having an anxiety attack at home. No abdominal pain. One episode of diarrhea. She is in no distress.  Abdomen is soft. HCG negative  Labs are reassuring with mild leukocytosis. Abdomen is soft without any right lower  quadrant tenderness. She is given IV fluids and IV antiemetics.  Feels improved in the ED. No further vomiting. She is tolerating by mouth. We'll give antiemetics for home use. Return precautions discussed. Final Clinical Impressions(s) / ED Diagnoses   Final diagnoses:  Non-intractable vomiting with nausea, unspecified vomiting type    New Prescriptions New Prescriptions   No medications on file     Glynn Octave, MD 11/01/16 [phone]

## 2016-11-07 ENCOUNTER — Encounter: Payer: Self-pay | Admitting: Family Medicine

## 2016-11-07 ENCOUNTER — Ambulatory Visit (INDEPENDENT_AMBULATORY_CARE_PROVIDER_SITE_OTHER): Payer: BLUE CROSS/BLUE SHIELD | Admitting: Family Medicine

## 2016-11-07 VITALS — BP 128/82 | Ht 65.0 in | Wt 155.0 lb

## 2016-11-07 DIAGNOSIS — F411 Generalized anxiety disorder: Secondary | ICD-10-CM | POA: Diagnosis not present

## 2016-11-07 MED ORDER — SERTRALINE HCL 50 MG PO TABS: 50.0000 mg | ORAL_TABLET | Freq: Every day | ORAL | 3 refills | Status: DC

## 2016-11-07 MED ORDER — HYDROXYZINE HCL 25 MG PO TABS: 25.0000 mg | ORAL_TABLET | Freq: Three times a day (TID) | ORAL | 0 refills | Status: DC | PRN

## 2016-11-07 NOTE — Progress Notes (Signed)
   Subjective:    Patient ID: Adrienne Church, female    DOB: 03-28-1986, 31 y.o.   MRN: [id number]  Anxiety  Presents for initial visit. Symptoms include panic. Primary symptoms comment: has been to ED 7 times this year for panic attacks.    has alprazolam but does not like taking it.  States when she gets stressed she gets anxious sometimes heart runs fast she feels nervous she states has a hard time resting feels her overwhelmed at times she denies being depressed but does sometimes get tearful she is not suicidal please see anxiety questionnaire and depression questionnaire   Review of Systems She does relate anxiousness at times some depression symptoms but mainly anxiousness with some panic attacks denies cough wheeze fever vomiting diarrhea    Objective:   Physical Exam She is very reasonable in her discussions Lungs clear heart regular Patient states she is not pregnant       Assessment & Plan:  Generalized anxiety disorder with panic attacks after long discussion patient does not want to be on nerve pills I recommend the patient try hydroxyzine when she feels stressed otherwise Zoloft 50 mg 1 daily patient was told that if that starts causing her to feel stressed and anxious or suicidal she is to immediately stop it and contact us in addition to this she is to give us feedback within the next 10-14 days how she is doing and follow-up again in 4-6 weeks patient was also instructed should she become pregnant to stop the medication and notify her gynecologist

## 2016-11-21 ENCOUNTER — Encounter: Payer: Self-pay | Admitting: Family Medicine

## 2016-12-06 ENCOUNTER — Ambulatory Visit (INDEPENDENT_AMBULATORY_CARE_PROVIDER_SITE_OTHER): Payer: BLUE CROSS/BLUE SHIELD | Admitting: Family Medicine

## 2016-12-06 ENCOUNTER — Encounter: Payer: Self-pay | Admitting: Family Medicine

## 2016-12-06 ENCOUNTER — Other Ambulatory Visit: Payer: Self-pay | Admitting: Family Medicine

## 2016-12-06 VITALS — BP 112/80 | Ht 65.0 in | Wt 159.0 lb

## 2016-12-06 DIAGNOSIS — F411 Generalized anxiety disorder: Secondary | ICD-10-CM | POA: Diagnosis not present

## 2016-12-06 MED ORDER — SERTRALINE HCL 50 MG PO TABS: 50.0000 mg | ORAL_TABLET | Freq: Every day | ORAL | 1 refills | Status: DC

## 2016-12-06 NOTE — Progress Notes (Deleted)
   Subjective:    Patient ID: Adrienne Church, female    DOB: [birth year], 31 y.o.   MRN: [id number]  HPI    Review of Systems     Objective:   Physical Exam        Assessment & Plan:

## 2016-12-06 NOTE — Progress Notes (Signed)
   Subjective:    Patient ID: Adrienne Church, female    DOB: [birth year], 31 y.o.   MRN: [id number]  Anxiety  Presents for follow-up visit.    Patient states she thinks the anxiety is better on the medication. No other concerns. 20 minutes spent with patient discussing her overall health and stress she denies being depressed. She does find herself on anxious at times nervous at times but denies panic attacks. She feels medication is helping significantly she does not one to be on a higher dose she once to maintain this dose she will give us feedback over the next month how she is doing   Review of Systems     Objective:   Physical Exam  Greater than half the time spent in direct discussion and counseling regarding her issue and answering questions  The patient is not trying to get pregnant currently if she does try to get pregnant she will notify us to taper off the medicine    Assessment & Plan:  Generalized anxiety continue Zoloft this is helping her. Warning signs were discussed. Follow-up if progressive troubles or worse. No sign of depression. Follow-up within 6 months.

## 2017-01-01 ENCOUNTER — Other Ambulatory Visit: Payer: Self-pay | Admitting: Family Medicine

## 2017-01-29 ENCOUNTER — Other Ambulatory Visit: Payer: Self-pay | Admitting: Family Medicine

## 2017-01-29 NOTE — Telephone Encounter (Signed)
Last seen 12/06/2016 

## 2017-02-16 ENCOUNTER — Other Ambulatory Visit: Payer: Self-pay | Admitting: Family Medicine

## 2017-04-02 ENCOUNTER — Emergency Department (HOSPITAL_COMMUNITY)
Admission: EM | Admit: 2017-04-02 | Discharge: 2017-04-02 | Disposition: A | Discharge status: Home / Self Care | Payer: BLUE CROSS/BLUE SHIELD | Attending: Emergency Medicine | Admitting: Emergency Medicine

## 2017-04-02 ENCOUNTER — Encounter (HOSPITAL_COMMUNITY): Payer: Self-pay | Admitting: *Deleted

## 2017-04-02 ENCOUNTER — Other Ambulatory Visit: Payer: Self-pay

## 2017-04-02 DIAGNOSIS — R112 Nausea with vomiting, unspecified: Secondary | ICD-10-CM | POA: Diagnosis not present

## 2017-04-02 DIAGNOSIS — K529 Noninfective gastroenteritis and colitis, unspecified: Secondary | ICD-10-CM | POA: Insufficient documentation

## 2017-04-02 DIAGNOSIS — Z79899 Other long term (current) drug therapy: Secondary | ICD-10-CM | POA: Diagnosis not present

## 2017-04-02 LAB — CBC WITH DIFFERENTIAL/PLATELET
Basophils Absolute: 0.1 10*3/uL (ref 0.0–0.1)
Basophils Relative: 1 %
Eosinophils Absolute: 0.2 10*3/uL (ref 0.0–0.7)
Eosinophils Relative: 2 %
HEMATOCRIT: 38.2 % (ref 36.0–46.0)
Hemoglobin: 12.8 g/dL (ref 12.0–15.0)
LYMPHS ABS: 3.9 10*3/uL (ref 0.7–4.0)
LYMPHS PCT: 31 %
MCH: 30.1 pg (ref 26.0–34.0)
MCHC: 33.5 g/dL (ref 30.0–36.0)
MCV: 89.9 fL (ref 78.0–100.0)
MONO ABS: 0.8 10*3/uL (ref 0.1–1.0)
MONOS PCT: 6 %
NEUTROS ABS: 7.7 10*3/uL (ref 1.7–7.7)
Neutrophils Relative %: 60 %
Platelets: 294 10*3/uL (ref 150–400)
RBC: 4.25 MIL/uL (ref 3.87–5.11)
RDW: 12.5 % (ref 11.5–15.5)
WBC: 12.7 10*3/uL — ABNORMAL HIGH (ref 4.0–10.5)

## 2017-04-02 LAB — HCG, SERUM, QUALITATIVE: Preg, Serum: NEGATIVE

## 2017-04-02 LAB — BASIC METABOLIC PANEL
ANION GAP: 6 (ref 5–15)
BUN: 9 mg/dL (ref 6–20)
CALCIUM: 9.8 mg/dL (ref 8.9–10.3)
CO2: 26 mmol/L (ref 22–32)
Chloride: 107 mmol/L (ref 101–111)
Creatinine, Ser: 0.79 mg/dL (ref 0.44–1.00)
GFR calc Af Amer: >60 mL/min (ref >60)
GFR calc non Af Amer: >60 mL/min (ref >60)
GLUCOSE: 103 mg/dL — AB (ref 65–99)
Potassium: 3.6 mmol/L (ref 3.5–5.1)
Sodium: 139 mmol/L (ref 135–145)

## 2017-04-02 MED ORDER — KETOROLAC TROMETHAMINE 30 MG/ML IJ SOLN
30.0000 mg | Freq: Once | INTRAMUSCULAR | Status: AC
  Administered 2017-04-02: 30 mg via INTRAVENOUS
  Filled 2017-04-02: qty 1

## 2017-04-02 MED ORDER — ONDANSETRON HCL 4 MG/2ML IJ SOLN
4.0000 mg | Freq: Once | INTRAMUSCULAR | Status: AC
  Administered 2017-04-02: 4 mg via INTRAVENOUS
  Filled 2017-04-02: qty 2

## 2017-04-02 MED ORDER — ONDANSETRON 8 MG PO TBDP: ORAL_TABLET | ORAL | 0 refills | Status: DC

## 2017-04-02 MED ORDER — SODIUM CHLORIDE 0.9 % IV BOLUS (SEPSIS)
1000.0000 mL | Freq: Once | INTRAVENOUS | Status: AC
  Administered 2017-04-02: 1000 mL via INTRAVENOUS

## 2017-04-02 NOTE — ED Triage Notes (Signed)
Pt c/o feeling like she was having a panic attack and she went to lay down and woke up around 11pm with vomiting

## 2017-04-02 NOTE — Discharge Instructions (Signed)
Zofran as prescribed as needed for nausea. ° °Clear liquid diet for the next 12 hours, then slowly advance as tolerated. ° °Return to the emergency department if you develop severe abdominal pain, high fevers, bloody stools, or other new and concerning symptoms. °

## 2017-04-02 NOTE — ED Notes (Addendum)
Pt alert & oriented x4, stable gait. Patient  given discharge instructions, paperwork & prescription(s). Patient verbalized understanding. Pt left department w/ no further questions. 

## 2017-04-02 NOTE — ED Provider Notes (Signed)
[facility] EMERGENCY DEPARTMENT Provider Note   CSN: [id number] Arrival date & time: 04/02/17  0101     History   Chief Complaint Chief Complaint  Patient presents with  . Emesis    HPI Adrienne Church is a 31 y.o. female.  Patient is a 31 year old female with no significant past medical history.  She presents today for evaluation of nausea.  This began earlier this evening.  Patient has a phobia of vomiting and became very anxious when she developed this nausea.  She apparently began to hyperventilate and her heart raced.  She denies any diarrhea.  She denies any abdominal pain.   The history is provided by the patient.  Emesis   This is a new problem. The current episode started 3 to 5 hours ago. The problem occurs continuously. The problem has not changed since onset.There has been no fever. Pertinent negatives include no chills and no fever.    Past Medical History:  Diagnosis Date  . MVP (mitral valve prolapse)   . Renal disorder    kidney stones    Patient Active Problem List   Diagnosis Date Noted  . Infertility counseling 11/22/2015    Past Surgical History:  Procedure Laterality Date  . BREAST REDUCTION SURGERY    . TENDON REPAIR      OB History    Gravida Para Term Preterm AB Living   1 1 1     1    SAB TAB Ectopic Multiple Live Births           1       Home Medications    Prior to Admission medications   Medication Sig Start Date End Date Taking? Authorizing Provider  fluocinonide cream (LIDEX) 0.05 % Apply 1 application topically 2 (two) times daily. Patient uses as needed.    [provider]  hydrOXYzine (ATARAX/VISTARIL) 25 MG tablet TAKE 1 TABLET(25 MG) BY MOUTH THREE TIMES DAILY AS NEEDED 01/01/17   Babs SciaraLuking, Scott A, MD  metroNIDAZOLE (FLAGYL) 500 MG tablet TAKE 1 TABLET BY MOUTH TWICE DAILY 02/16/17   Merlyn AlbertLuking, William S, MD  ondansetron (ZOFRAN-ODT) 4 MG disintegrating tablet DISSOLVE ONE TABLET BY MOUTH EVERY 8 HOURS AS NEEDED FOR  NAUSEA OR VOMITING 01/29/17   Babs SciaraLuking, Scott A, MD  Prenatal Multivit-Min-Fe-FA (PRE-NATAL PO) Take 1 tablet by mouth daily.    [provider]  sertraline (ZOLOFT) 50 MG tablet Take 1 tablet (50 mg total) by mouth daily. 12/06/16   Babs SciaraLuking, Scott A, MD  valACYclovir (VALTREX) 1000 MG tablet 2 pills now and 2 pills 12 hours from now 06/05/16   [provider]    Family History Family History  Problem Relation Age of Onset  . Kidney disease Mother   . Mental illness Mother   . Thyroid disease Mother   . Mitral valve prolapse Father     Social History Social History   Tobacco Use  . Smoking status: Never Smoker  . Smokeless tobacco: Never Used  Substance Use Topics  . Alcohol use: Yes    Comment: occ  . Drug use: No     Allergies   Azithromycin   Review of Systems Review of Systems  Constitutional: Negative for chills and fever.  Gastrointestinal: Positive for vomiting.  All other systems reviewed and are negative.    Physical Exam Updated Vital Signs BP 140/81 (BP Location: Left Arm)   Pulse 97   Temp 97.8 F (36.6 C) (Oral)   Resp 20  Ht 5\' 5"  (1.651 m)   Wt 72.6 kg (160 lb)   LMP 03/12/2017   SpO2 100%   BMI 26.63 kg/m   Physical Exam  Constitutional: She is oriented to person, place, and time. She appears well-developed and well-nourished. No distress.  HENT:  Head: Normocephalic and atraumatic.  Neck: Normal range of motion. Neck supple.  Cardiovascular: Normal rate and regular rhythm. Exam reveals no gallop and no friction rub.  No murmur heard. Pulmonary/Chest: Effort normal and breath sounds normal. No respiratory distress. She has no wheezes.  Abdominal: Soft. Bowel sounds are normal. She exhibits no distension. There is no tenderness.  Musculoskeletal: Normal range of motion.  Neurological: She is alert and oriented to person, place, and time.  Skin: Skin is warm and dry. She is not diaphoretic.  Nursing note and vitals  reviewed.    ED Treatments / Results  Labs (all labs ordered are listed, but only abnormal results are displayed) Labs Reviewed  BASIC METABOLIC PANEL - Abnormal; Notable for the following components:      Result Value   Glucose, Bld 103 (*)    All other components within normal limits  CBC WITH DIFFERENTIAL/PLATELET - Abnormal; Notable for the following components:   WBC 12.7 (*)    All other components within normal limits  HCG, SERUM, QUALITATIVE    EKG  EKG Interpretation None       Radiology No results found.  Procedures Procedures (including critical care time)  Medications Ordered in ED Medications  sodium chloride 0.9 % bolus 1,000 mL (1,000 mLs Intravenous New Bag/Given 04/02/17 0216)  ondansetron (ZOFRAN) injection 4 mg (4 mg Intravenous Given 04/02/17 0214)  ketorolac (TORADOL) 30 MG/ML injection 30 mg (30 mg Intravenous Given 04/02/17 0216)     Initial Impression / Assessment and Plan / ED Course  I have reviewed the triage vital signs and the nursing notes.  Pertinent labs & imaging results that were available during my care of the patient were reviewed by me and considered in my medical decision making (see chart for details).  Patient's presentation, physical exam, and workup is most consistent with a viral enteritis.  She is feeling better after IV fluids and antiemetics.  I see no indication for further workup.  She will be discharged with Zofran and as needed return.  Final Clinical Impressions(s) / ED Diagnoses   Final diagnoses:  None    ED Discharge Orders    None       Geoffery Lyonselo, Orrie Lascano, MD 04/02/17 [phone]

## 2017-04-09 ENCOUNTER — Other Ambulatory Visit: Payer: Self-pay | Admitting: Family Medicine

## 2017-04-26 ENCOUNTER — Other Ambulatory Visit: Payer: Self-pay | Admitting: Family Medicine

## 2017-05-18 ENCOUNTER — Other Ambulatory Visit: Payer: Self-pay

## 2017-05-18 ENCOUNTER — Encounter (HOSPITAL_COMMUNITY): Payer: Self-pay

## 2017-05-18 ENCOUNTER — Emergency Department (HOSPITAL_COMMUNITY)
Admission: EM | Admit: 2017-05-18 | Discharge: 2017-05-18 | Disposition: A | Discharge status: Home / Self Care | Payer: BLUE CROSS/BLUE SHIELD | Attending: Emergency Medicine | Admitting: Emergency Medicine

## 2017-05-18 DIAGNOSIS — R51 Headache: Secondary | ICD-10-CM | POA: Insufficient documentation

## 2017-05-18 DIAGNOSIS — Z79899 Other long term (current) drug therapy: Secondary | ICD-10-CM | POA: Insufficient documentation

## 2017-05-18 DIAGNOSIS — R519 Headache, unspecified: Secondary | ICD-10-CM

## 2017-05-18 DIAGNOSIS — R110 Nausea: Secondary | ICD-10-CM | POA: Insufficient documentation

## 2017-05-18 LAB — CBC WITH DIFFERENTIAL/PLATELET
Basophils Absolute: 0.1 10*3/uL (ref 0.0–0.1)
Basophils Relative: 1 %
EOS PCT: 2 %
Eosinophils Absolute: 0.2 10*3/uL (ref 0.0–0.7)
HEMATOCRIT: 36.5 % (ref 36.0–46.0)
Hemoglobin: 12.4 g/dL (ref 12.0–15.0)
LYMPHS ABS: 3.8 10*3/uL (ref 0.7–4.0)
LYMPHS PCT: 46 %
MCH: 30.1 pg (ref 26.0–34.0)
MCHC: 34 g/dL (ref 30.0–36.0)
MCV: 88.6 fL (ref 78.0–100.0)
MONO ABS: 0.6 10*3/uL (ref 0.1–1.0)
Monocytes Relative: 7 %
Neutro Abs: 3.6 10*3/uL (ref 1.7–7.7)
Neutrophils Relative %: 44 %
PLATELETS: 323 10*3/uL (ref 150–400)
RBC: 4.12 MIL/uL (ref 3.87–5.11)
RDW: 12.3 % (ref 11.5–15.5)
WBC: 8.2 10*3/uL (ref 4.0–10.5)

## 2017-05-18 LAB — COMPREHENSIVE METABOLIC PANEL
ALT: 21 U/L (ref 14–54)
AST: 24 U/L (ref 15–41)
Albumin: 4.2 g/dL (ref 3.5–5.0)
Alkaline Phosphatase: 58 U/L (ref 38–126)
Anion gap: 9 (ref 5–15)
BILIRUBIN TOTAL: 0.4 mg/dL (ref 0.3–1.2)
BUN: 10 mg/dL (ref 6–20)
CHLORIDE: 107 mmol/L (ref 101–111)
CO2: 23 mmol/L (ref 22–32)
CREATININE: 0.89 mg/dL (ref 0.44–1.00)
Calcium: 9.5 mg/dL (ref 8.9–10.3)
Glucose, Bld: 114 mg/dL — ABNORMAL HIGH (ref 65–99)
Potassium: 3.2 mmol/L — ABNORMAL LOW (ref 3.5–5.1)
Sodium: 139 mmol/L (ref 135–145)
TOTAL PROTEIN: 7.9 g/dL (ref 6.5–8.1)

## 2017-05-18 LAB — URINALYSIS, ROUTINE W REFLEX MICROSCOPIC
Bilirubin Urine: NEGATIVE
Glucose, UA: NEGATIVE mg/dL
Hgb urine dipstick: NEGATIVE
Ketones, ur: NEGATIVE mg/dL
Leukocytes, UA: NEGATIVE
Nitrite: NEGATIVE
Protein, ur: NEGATIVE mg/dL
Specific Gravity, Urine: 1.001 — ABNORMAL LOW (ref 1.005–1.030)
pH: 7 (ref 5.0–8.0)

## 2017-05-18 LAB — PREGNANCY, URINE: PREG TEST UR: NEGATIVE

## 2017-05-18 MED ORDER — POTASSIUM CHLORIDE CRYS ER 20 MEQ PO TBCR
40.0000 meq | EXTENDED_RELEASE_TABLET | Freq: Once | ORAL | Status: AC
  Administered 2017-05-18: 40 meq via ORAL
  Filled 2017-05-18: qty 2

## 2017-05-18 MED ORDER — PROMETHAZINE HCL 25 MG/ML IJ SOLN
12.5000 mg | Freq: Once | INTRAMUSCULAR | Status: AC
  Administered 2017-05-18: 12.5 mg via INTRAVENOUS
  Filled 2017-05-18: qty 1

## 2017-05-18 MED ORDER — SODIUM CHLORIDE 0.9 % IV BOLUS (SEPSIS)
1000.0000 mL | Freq: Once | INTRAVENOUS | Status: AC
  Administered 2017-05-18: 1000 mL via INTRAVENOUS

## 2017-05-18 MED ORDER — ONDANSETRON HCL 4 MG/2ML IJ SOLN
4.0000 mg | Freq: Once | INTRAMUSCULAR | Status: AC
  Administered 2017-05-18: 4 mg via INTRAVENOUS
  Filled 2017-05-18: qty 2

## 2017-05-18 MED ORDER — DIPHENHYDRAMINE HCL 50 MG/ML IJ SOLN
25.0000 mg | Freq: Once | INTRAMUSCULAR | Status: AC
  Administered 2017-05-18: 25 mg via INTRAVENOUS
  Filled 2017-05-18: qty 1

## 2017-05-18 MED ORDER — PROMETHAZINE HCL 25 MG PO TABS: 25.0000 mg | ORAL_TABLET | Freq: Four times a day (QID) | ORAL | 0 refills | Status: DC | PRN

## 2017-05-18 MED ORDER — HYDROXYZINE HCL 25 MG PO TABS
50.0000 mg | ORAL_TABLET | Freq: Once | ORAL | Status: AC
  Administered 2017-05-18: 50 mg via ORAL
  Filled 2017-05-18: qty 2

## 2017-05-18 MED ORDER — METOCLOPRAMIDE HCL 5 MG/ML IJ SOLN
10.0000 mg | Freq: Once | INTRAMUSCULAR | Status: AC
  Administered 2017-05-18: 10 mg via INTRAVENOUS
  Filled 2017-05-18: qty 2

## 2017-05-18 MED ORDER — KETOROLAC TROMETHAMINE 30 MG/ML IJ SOLN: 30.0000 mg | Freq: Once | INTRAMUSCULAR | Status: DC

## 2017-05-18 NOTE — Discharge Instructions (Signed)
Your labs are reassuring. Continue your atarax as needed for anxiety. Use the nausea medication as prescribed. Follow up with your doctor. Return to the ED if you develop new or worsening symptoms.

## 2017-05-18 NOTE — ED Notes (Signed)
Pt spouse approaches nurse to inform he "thinks pt is having a reaction to Reglan b/c she is crying and anxious". Five minutes prior to this pt spouse asked the results of pregnancy test. This nurse informed him that test was negative. Dr Manus Gunningancour made aware of situation.  Upon entering room to assess pt reported reaction to medication, pt is tearful, anxious, and saying she doesn't feel good, however could not describe symptoms of "feeling bad." Vitals are within normal limits.

## 2017-05-18 NOTE — ED Notes (Signed)
Pt ambulatory to bathroom escorted by spouse.

## 2017-05-18 NOTE — ED Notes (Signed)
ED Provider at bedside. 

## 2017-05-18 NOTE — ED Provider Notes (Signed)
[facility] EMERGENCY DEPARTMENT Provider Note   CSN: [id number] Arrival date & time: 05/18/17  0136     History   Chief Complaint Chief Complaint  Patient presents with  . Nausea  . Headache    HPI Adrienne Church is a 32 y.o. female.  Patient with history of anxiety presenting with gradual onset posterior headache started this afternoon.  Several hours ago she developed nausea with dry heaves and bilateral tingling in her hands.  This is starting to improve.  States she has not been feeling well for most of the evening and her husband checked her blood pressure at home which was elevated at 150/100.  He describes feeling lightheaded with persistent nausea and tingling in all of the digits of her bilateral hands.  Denies any focal weakness, difficulty speaking or difficulty swallowing.  No chest pain or shortness of breath.  No abdominal pain.  Last menstrual cycle December 6.  States she normally does not take anything for her anxiety other than Zoloft.  Did take Zofran at home without relief.   The history is provided by the patient.  Headache   Pertinent negatives include no fever, no shortness of breath, no nausea and no vomiting.    Past Medical History:  Diagnosis Date  . MVP (mitral valve prolapse)   . Renal disorder    kidney stones    Patient Active Problem List   Diagnosis Date Noted  . Infertility counseling 11/22/2015    Past Surgical History:  Procedure Laterality Date  . BREAST REDUCTION SURGERY    . TENDON REPAIR      OB History    Gravida Para Term Preterm AB Living   1 1 1     1    SAB TAB Ectopic Multiple Live Births           1       Home Medications    Prior to Admission medications   Medication Sig Start Date End Date Taking? Authorizing Provider  hydrOXYzine (ATARAX/VISTARIL) 25 MG tablet TAKE 1 TABLET(25 MG) BY MOUTH THREE TIMES DAILY AS NEEDED 04/09/17  Yes Babs Sciara, MD  ondansetron (ZOFRAN ODT) 8 MG disintegrating tablet 8mg   ODT q4 hours prn nausea 04/02/17  Yes Delo, Riley Lam, MD  Prenatal Multivit-Min-Fe-FA (PRE-NATAL PO) Take 1 tablet by mouth daily.   Yes [provider]  sertraline (ZOLOFT) 50 MG tablet Take 1 tablet (50 mg total) by mouth daily. 12/06/16  Yes Babs Sciara, MD  fluocinonide cream (LIDEX) 0.05 % APPLY TO THE AFFECTED AREAS AS NEEDED 04/26/17   Merlyn Albert, MD  metroNIDAZOLE (FLAGYL) 500 MG tablet TAKE 1 TABLET BY MOUTH TWICE DAILY 02/16/17   Merlyn Albert, MD  valACYclovir (VALTREX) 1000 MG tablet 2 pills now and 2 pills 12 hours from now 06/05/16   [provider]    Family History Family History  Problem Relation Age of Onset  . Kidney disease Mother   . Mental illness Mother   . Thyroid disease Mother   . Mitral valve prolapse Father     Social History Social History   Tobacco Use  . Smoking status: Never Smoker  . Smokeless tobacco: Never Used  Substance Use Topics  . Alcohol use: Yes    Comment: occ  . Drug use: No     Allergies   Azithromycin   Review of Systems Review of Systems  Constitutional: Negative for activity change, appetite change and fever.  HENT: Negative for  congestion.   Eyes: Negative for photophobia and visual disturbance.  Respiratory: Negative for cough, chest tightness and shortness of breath.   Cardiovascular: Negative for chest pain.  Gastrointestinal: Negative for abdominal pain, nausea and vomiting.  Genitourinary: Negative for dysuria, flank pain, hematuria, vaginal bleeding and vaginal discharge.  Musculoskeletal: Negative for arthralgias and myalgias.  Skin: Negative for rash.  Neurological: Positive for dizziness, light-headedness, numbness and headaches. Negative for weakness.   all other systems are negative except as noted in the HPI and PMH.     Physical Exam Updated Vital Signs BP (!) 146/83 (BP Location: Left Arm)   Pulse 94   Temp 97.9 F (36.6 C) (Oral)   Resp 20   Ht 5\' 5"  (1.651 m)   Wt  72.6 kg (160 lb)   LMP 04/12/2017 (Exact Date)   SpO2 100%   BMI 26.63 kg/m   Physical Exam  Constitutional: She is oriented to person, place, and time. She appears well-developed and well-nourished. No distress.  Anxious appearing  HENT:  Head: Normocephalic and atraumatic.  Mouth/Throat: Oropharynx is clear and moist. No oropharyngeal exudate.  Eyes: Conjunctivae and EOM are normal. Pupils are equal, round, and reactive to light.  Neck: Normal range of motion. Neck supple.  No meningismus.  Cardiovascular: Normal rate, regular rhythm, normal heart sounds and intact distal pulses.  No murmur heard. Pulmonary/Chest: Effort normal and breath sounds normal. No respiratory distress.  Abdominal: Soft. There is no tenderness. There is no rebound and no guarding.  Musculoskeletal: Normal range of motion. She exhibits no edema or tenderness.  Neurological: She is alert and oriented to person, place, and time. No cranial nerve deficit. She exhibits normal muscle tone. Coordination normal.   CN 2-12 intact, no ataxia on finger to nose, no nystagmus, 5/5 strength throughout, no pronator drift, Romberg negative, normal gait. Distal sensation intact.    Skin: Skin is warm.  Psychiatric: She has a normal mood and affect. Her behavior is normal.  Nursing note and vitals reviewed.    ED Treatments / Results  Labs (all labs ordered are listed, but only abnormal results are displayed) Labs Reviewed  URINALYSIS, ROUTINE W REFLEX MICROSCOPIC - Abnormal; Notable for the following components:      Result Value   Color, Urine COLORLESS (*)    Specific Gravity, Urine 1.001 (*)    All other components within normal limits  COMPREHENSIVE METABOLIC PANEL - Abnormal; Notable for the following components:   Potassium 3.2 (*)    Glucose, Bld 114 (*)    All other components within normal limits  PREGNANCY, URINE  CBC WITH DIFFERENTIAL/PLATELET    EKG  EKG Interpretation None        Radiology No results found.  Procedures Procedures (including critical care time)  Medications Ordered in ED Medications  sodium chloride 0.9 % bolus 1,000 mL (not administered)  ondansetron (ZOFRAN) injection 4 mg (not administered)  metoCLOPramide (REGLAN) injection 10 mg (not administered)  diphenhydrAMINE (BENADRYL) injection 25 mg (not administered)     Initial Impression / Assessment and Plan / ED Course  I have reviewed the triage vital signs and the nursing notes.  Pertinent labs & imaging results that were available during my care of the patient were reviewed by me and considered in my medical decision making (see chart for details).    Patient gradual onset headache, numbness in bilateral hands, nausea and dry heaves.  Nonfocal neurological exam.  No fever. Low suspicion for subarachnoid hemorrhage or meningitis.]  HCG is negative. Labs reassuring, mild hypokalemia.  IVF and PO fluids given.  Patient feeling improved with symptom control. No vomiting in the ED. States phenergan works better than zofran.  Anxiety likely contributing to symptoms. Abdomen benign. Declines ativan in the ED. UA negative. Continue PRN atarax at home for anxiety, antiemetics, followup with PCP. Return precautions discussed.  Final Clinical Impressions(s) / ED Diagnoses   Final diagnoses:  Nausea  Headache, unspecified headache type    ED Discharge Orders    None       Leronda Lewers, Jeannett SeniorStephen, MD 05/18/17 [phone]

## 2017-05-18 NOTE — ED Triage Notes (Signed)
Pt states she started feeling nauseated a couple of hours ago, has a mild headache and feeling tingly in both hands.  Pt states she has been dry heaving and having some diarrhea.

## 2017-06-08 ENCOUNTER — Other Ambulatory Visit: Payer: Self-pay | Admitting: Family Medicine

## 2017-06-08 ENCOUNTER — Encounter: Payer: Self-pay | Admitting: Family Medicine

## 2017-06-08 ENCOUNTER — Ambulatory Visit (INDEPENDENT_AMBULATORY_CARE_PROVIDER_SITE_OTHER): Payer: Self-pay | Admitting: Family Medicine

## 2017-06-08 VITALS — BP 118/78 | Ht 65.0 in | Wt 170.0 lb

## 2017-06-08 DIAGNOSIS — Z13220 Encounter for screening for lipoid disorders: Secondary | ICD-10-CM

## 2017-06-08 DIAGNOSIS — E876 Hypokalemia: Secondary | ICD-10-CM

## 2017-06-08 DIAGNOSIS — F411 Generalized anxiety disorder: Secondary | ICD-10-CM

## 2017-06-08 MED ORDER — SERTRALINE HCL 100 MG PO TABS: 100.0000 mg | ORAL_TABLET | Freq: Every day | ORAL | 2 refills | Status: DC

## 2017-06-08 NOTE — Progress Notes (Signed)
   Subjective:    Patient ID: Adrienne DraftsRachel J Church, female    DOB: [birth year], 32 y.o.   MRN: [id number]  HPI Patient arrives for a follow up on anxiety. GAD 7 : Generalized Anxiety Score 11/07/2016  Nervous, Anxious, on Edge 3  Control/stop worrying 3  Worry too much - different things 3  Trouble relaxing 2  Restless 2  Easily annoyed or irritable 3  Afraid - awful might happen 1  Total GAD 7 Score 17  Anxiety Difficulty Somewhat difficult      Review of Systems  Constitutional: Negative for activity change, fatigue and fever.  HENT: Negative for congestion.   Respiratory: Negative for cough, chest tightness and shortness of breath.   Cardiovascular: Negative for chest pain and leg swelling.  Gastrointestinal: Negative for abdominal pain.  Skin: Negative for color change.  Neurological: Negative for headaches.  Psychiatric/Behavioral: Negative for behavioral problems.       Objective:   Physical Exam  Constitutional: She appears well-developed and well-nourished. No distress.  HENT:  Head: Normocephalic and atraumatic.  Eyes: Right eye exhibits no discharge. Left eye exhibits no discharge.  Neck: No tracheal deviation present.  Cardiovascular: Normal rate, regular rhythm and normal heart sounds.  No murmur heard. Pulmonary/Chest: Effort normal and breath sounds normal. No respiratory distress. She has no wheezes. She has no rales.  Musculoskeletal: She exhibits no edema.  Lymphadenopathy:    She has no cervical adenopathy.  Neurological: She is alert. She exhibits normal muscle tone.  Skin: Skin is warm and dry. No erythema.  Psychiatric: Her behavior is normal.  Vitals reviewed.         Assessment & Plan:  Generalized anxiety disorder-actually doing fairly well but recently under fair amount of stress therefore she increased her medicine 100 mg this seems to be doing better for she will send us an update in 1 month otherwise we will continue 100 mg daily follow-up in  6-9 months patient not suicidal  Patient recent visit to the ER potassium was slightly low we will reaching recheck metabolic 7 and magnesium patient also needs a screening for lipid

## 2017-07-02 ENCOUNTER — Encounter: Payer: Self-pay | Admitting: Family Medicine

## 2017-07-02 ENCOUNTER — Other Ambulatory Visit: Payer: Self-pay | Admitting: Family Medicine

## 2017-07-02 MED ORDER — AMOXICILLIN 500 MG PO TABS: 500.0000 mg | ORAL_TABLET | Freq: Three times a day (TID) | ORAL | 0 refills | Status: DC

## 2017-07-26 ENCOUNTER — Other Ambulatory Visit: Payer: Self-pay | Admitting: Family Medicine

## 2017-08-13 ENCOUNTER — Ambulatory Visit (INDEPENDENT_AMBULATORY_CARE_PROVIDER_SITE_OTHER): Payer: Self-pay | Admitting: Family Medicine

## 2017-08-13 ENCOUNTER — Encounter: Payer: Self-pay | Admitting: Family Medicine

## 2017-08-13 VITALS — BP 118/76 | Ht 65.0 in | Wt 175.8 lb

## 2017-08-13 DIAGNOSIS — F988 Other specified behavioral and emotional disorders with onset usually occurring in childhood and adolescence: Secondary | ICD-10-CM

## 2017-08-13 MED ORDER — AMPHETAMINE-DEXTROAMPHET ER 10 MG PO CP24: 10.0000 mg | ORAL_CAPSULE | Freq: Every day | ORAL | 0 refills | Status: DC

## 2017-08-13 NOTE — Progress Notes (Signed)
   Subjective:    Patient ID: Adrienne DraftsRachel J Khaimov, female    DOB: 03/03/1986, 32 y.o.   MRN: [id number]  HPI Patient arrives for a follow up on anxiety. Patient denies being depressed patient denies being anxious She states she finds herself fidgeting a lot having a hard time getting her thinking to calm down and it just causes her to have a hard time completing tasks patient states she had similar symptoms in middle school and high school and did not do well in college initially because of this  She states she has a friend who has ADD who gave her several days worth of medicine she took these pills and it did help her focus better and helped her feel better.  She would like to try these medications  She states she does not feel she needs Zoloft anymore she denies being depressed and only occasionally is anxious  Review of Systems  Constitutional: Negative for activity change, fatigue and fever.  HENT: Negative for congestion.   Respiratory: Negative for cough, chest tightness and shortness of breath.   Cardiovascular: Negative for chest pain and leg swelling.  Gastrointestinal: Negative for abdominal pain.  Skin: Negative for color change.  Neurological: Negative for headaches.  Psychiatric/Behavioral: Negative for behavioral problems.       Objective:   Physical Exam  Constitutional: She appears well-developed and well-nourished. No distress.  HENT:  Head: Normocephalic and atraumatic.  Eyes: Right eye exhibits no discharge. Left eye exhibits no discharge.  Neck: No tracheal deviation present.  Cardiovascular: Normal rate, regular rhythm and normal heart sounds.  No murmur heard. Pulmonary/Chest: Effort normal and breath sounds normal. No respiratory distress. She has no wheezes. She has no rales.  Musculoskeletal: She exhibits no edema.  Lymphadenopathy:    She has no cervical adenopathy.  Neurological: She is alert. She exhibits normal muscle tone.  Skin: Skin is warm and dry.  No erythema.  Psychiatric: Her behavior is normal.  Vitals reviewed.  Patient filled out 2 separate ADD questionnaires both of these were positive for diagnosis of ADD The diagnosis is a clinical diagnosis based on the interview based on these questions I do believe the patient does have ADD  We did discuss potential side effects as well as potential for habit forming/addiction patient does not use any type of drugs denies any type of abuse issues       Assessment & Plan:  Adult ADD Start with Adderall XR 10 mg every morning #30 follow-up 4 weeks to check blood pressure and weight Patient was told side effects if any problems stop medicine follow-up immediately

## 2017-08-14 ENCOUNTER — Encounter: Payer: Self-pay | Admitting: Family Medicine

## 2017-08-31 ENCOUNTER — Encounter (HOSPITAL_COMMUNITY): Payer: Self-pay

## 2017-08-31 ENCOUNTER — Emergency Department (HOSPITAL_COMMUNITY)
Admission: EM | Admit: 2017-08-31 | Discharge: 2017-08-31 | Disposition: A | Discharge status: Home / Self Care | Payer: BLUE CROSS/BLUE SHIELD | Attending: Emergency Medicine | Admitting: Emergency Medicine

## 2017-08-31 DIAGNOSIS — F410 Panic disorder [episodic paroxysmal anxiety] without agoraphobia: Secondary | ICD-10-CM | POA: Insufficient documentation

## 2017-08-31 DIAGNOSIS — Z5321 Procedure and treatment not carried out due to patient leaving prior to being seen by health care provider: Secondary | ICD-10-CM | POA: Insufficient documentation

## 2017-08-31 HISTORY — DX: Anxiety disorder, unspecified: F41.9

## 2017-08-31 NOTE — ED Notes (Signed)
Pt told registration that she was leaving.  

## 2017-08-31 NOTE — ED Triage Notes (Signed)
Pt states she got into an argument with her husband and now she is afraid to go back home bc she is "scared of what he might do"  [facility] [place] [facility] about same.  Pt states she is having a panic attack and her hands are tingling.

## 2017-09-06 ENCOUNTER — Ambulatory Visit (INDEPENDENT_AMBULATORY_CARE_PROVIDER_SITE_OTHER): Payer: Self-pay | Admitting: Family Medicine

## 2017-09-06 ENCOUNTER — Other Ambulatory Visit: Payer: Self-pay | Admitting: Family Medicine

## 2017-09-06 ENCOUNTER — Encounter: Payer: Self-pay | Admitting: Family Medicine

## 2017-09-06 VITALS — BP 110/80 | Ht 65.0 in | Wt 171.0 lb

## 2017-09-06 DIAGNOSIS — F988 Other specified behavioral and emotional disorders with onset usually occurring in childhood and adolescence: Secondary | ICD-10-CM

## 2017-09-06 DIAGNOSIS — F411 Generalized anxiety disorder: Secondary | ICD-10-CM

## 2017-09-06 MED ORDER — AMPHETAMINE-DEXTROAMPHET ER 20 MG PO CP24: 20.0000 mg | ORAL_CAPSULE | Freq: Every day | ORAL | 0 refills | Status: DC

## 2017-09-06 NOTE — Progress Notes (Signed)
   Subjective:    Patient ID: Adrienne Church, female    DOB: [birth year], 32 y.o.   MRN: [id number]  HPI Patient is here today to follow up on Add.She states the medication was helping her not sure it is helping quite enough. She states she has been able to cut back on the Zoloft to 1/2 of 100 mg tablet. This patient relates that she did have what she thought was a panic attack went to the ER waited for a while was not seen in the left  She does state her ADD medicine is helping her but she feels she could tolerate a higher dose Review of Systems  Constitutional: Negative for activity change, appetite change and fatigue.  HENT: Negative for congestion.   Respiratory: Negative for cough.   Cardiovascular: Negative for chest pain.  Gastrointestinal: Negative for abdominal pain.  Skin: Negative for color change.  Neurological: Negative for headaches.  Psychiatric/Behavioral: Negative for behavioral problems.       Objective:   Physical Exam  Constitutional: She appears well-developed and well-nourished.  HENT:  Head: Normocephalic.  Cardiovascular: Normal rate, regular rhythm and normal heart sounds.  No murmur heard. Pulmonary/Chest: Effort normal and breath sounds normal.  Neurological: She is alert.  Skin: Skin is warm and dry.  Psychiatric: She has a normal mood and affect.  Vitals reviewed.         Assessment & Plan:  Generalized anxiety disorder I think it is beneficial for the patient to go ahead with Zoloft at a regular basis.  ADD-she does not feel that current dose is doing enough so therefore we will bump the dose up to 20 mg she will give [place] feedback over the course of the next several weeks how that is going then at that point time we can issue additional prescription  Follow-up 3 months

## 2017-09-21 ENCOUNTER — Encounter: Payer: Self-pay | Admitting: Family Medicine

## 2017-09-28 ENCOUNTER — Telehealth: Payer: Self-pay | Admitting: Family Medicine

## 2017-09-28 MED ORDER — AMPHETAMINE-DEXTROAMPHET ER 20 MG PO CP24: 20.0000 mg | ORAL_CAPSULE | Freq: Every day | ORAL | 0 refills | Status: DC

## 2017-09-28 NOTE — Addendum Note (Signed)
Addended by: Meredith Leeds on: 09/28/2017 02:29 PM   Modules accepted: Orders

## 2017-09-28 NOTE — Telephone Encounter (Signed)
Please advise 

## 2017-09-28 NOTE — Telephone Encounter (Signed)
Patient is aware and states she has plenty and will pick up the scripts on Tuesday.

## 2017-09-28 NOTE — Telephone Encounter (Signed)
Nurses-please verify with her pharmacy the date she got this prescription filled.  It would be fine to go ahead and do 3 additional prescriptions.  Then the patient will need to do a follow-up office visit toward the end of the last prescription-please state that 3 prescriptions according to when the date of the last prescription was filled

## 2017-09-28 NOTE — Telephone Encounter (Signed)
Patient said she notified Dr. Lorin Picket that the Adderall 20 mg is working well for her through [facility].  She has a over a week left on the medication, but wants to know if Dr. Lorin Picket will go ahead and write the 3 prescriptions he discussed at her last visit.  [facility]

## 2017-09-28 NOTE — Telephone Encounter (Signed)
Per Emanie at [facility] [address]. Patient last picked up on 09/06/2017. Three scripts are written and awaiting signature.

## 2017-11-28 IMAGING — CT CT RENAL STONE PROTOCOL
3 of 4 series · 8 of 46 positions shown, 15 images · non-contrast
Comparison: CT abdomen and pelvis 10/08/2010.

CLINICAL DATA: Lower abdominal pain since 1 a.m. today. Nausea.
History of urinary tract stones.

EXAM:
CT ABDOMEN AND PELVIS WITHOUT CONTRAST
TECHNIQUE: Multidetector CT imaging of the abdomen and pelvis was performed
following the standard protocol without IV contrast.

[Series 3: lung 5.0 b60f · axial · 0.67mm/px · z∈[-212,-162]mm · 4 of 18 slices shown, 9 images]
[im 4/18  soft-tissue]
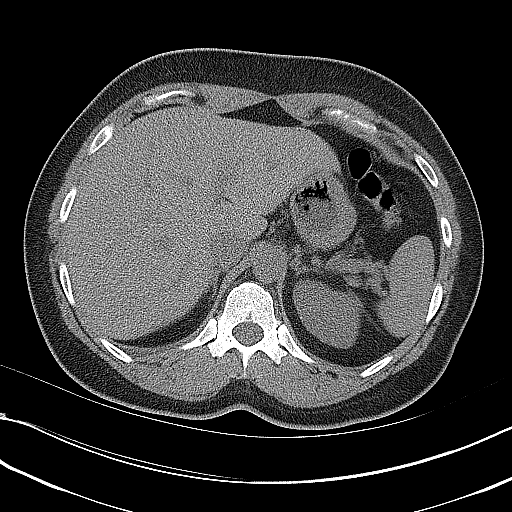
[im 4/18  lung]
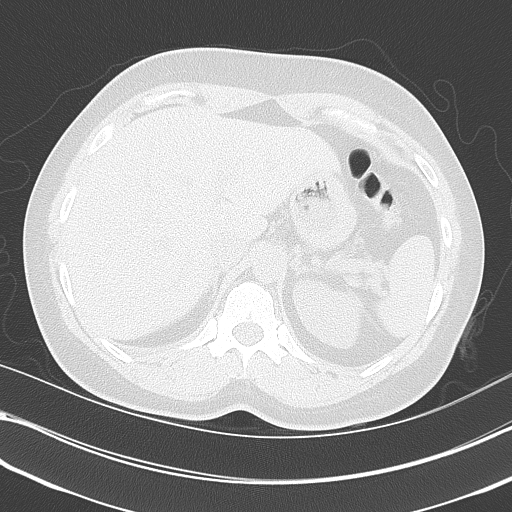
[im 4/18  bone]
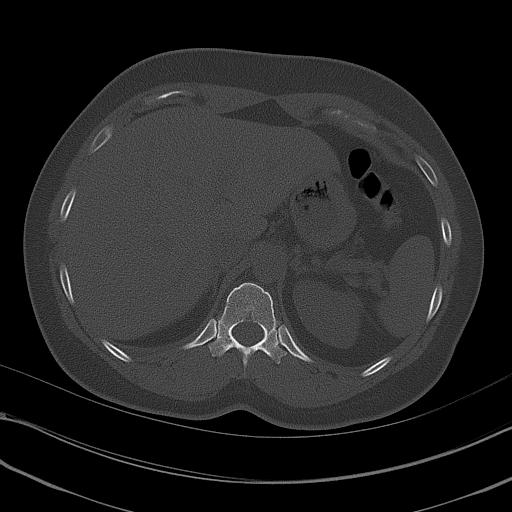
[im 7/18  soft-tissue]
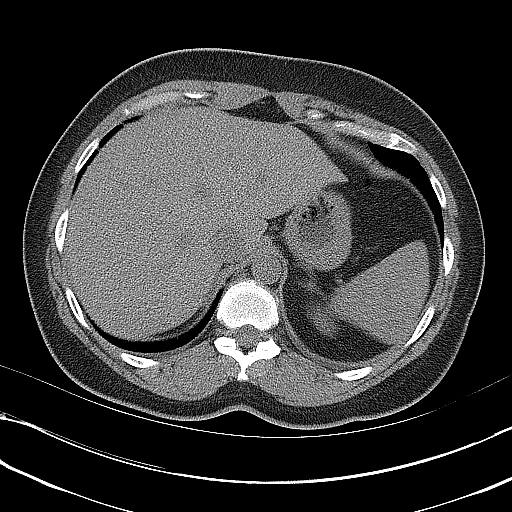
[im 7/18  lung]
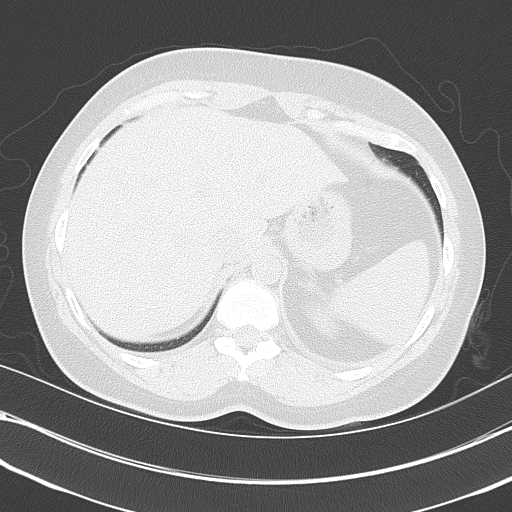
[im 11/18  soft-tissue]
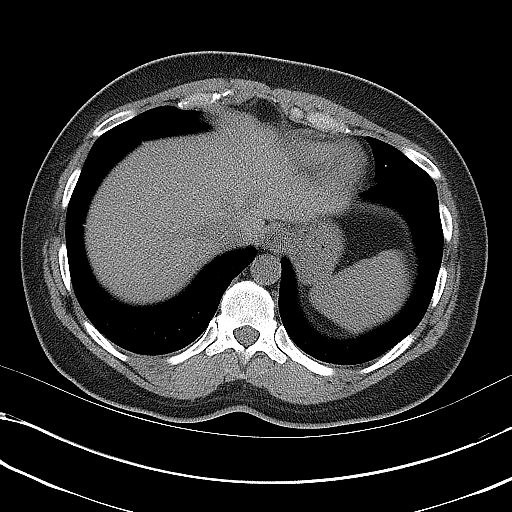
[im 11/18  lung]
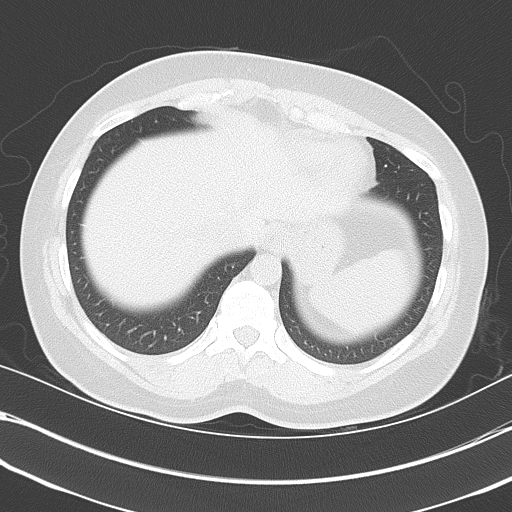
[im 14/18  soft-tissue]
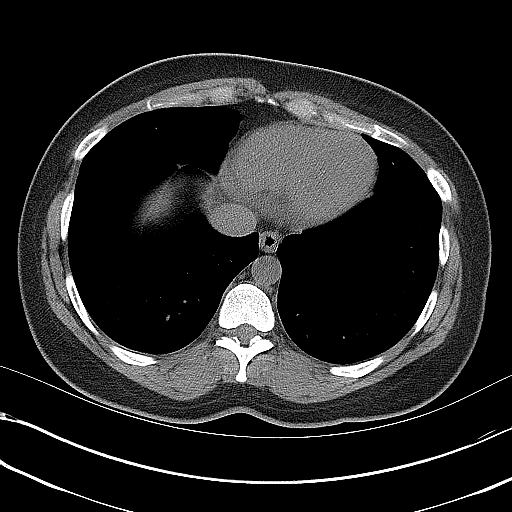
[im 14/18  lung]
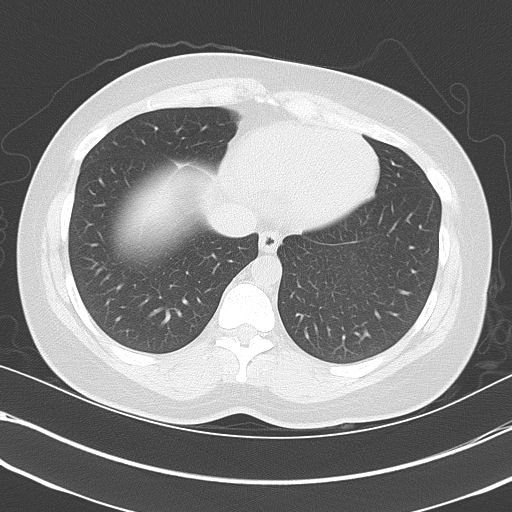

[Series 4: mpr coronal (id) · coronal · 0.68mm/px · 3 of 80 slices shown, 4 images]
[im 27/80  soft-tissue]
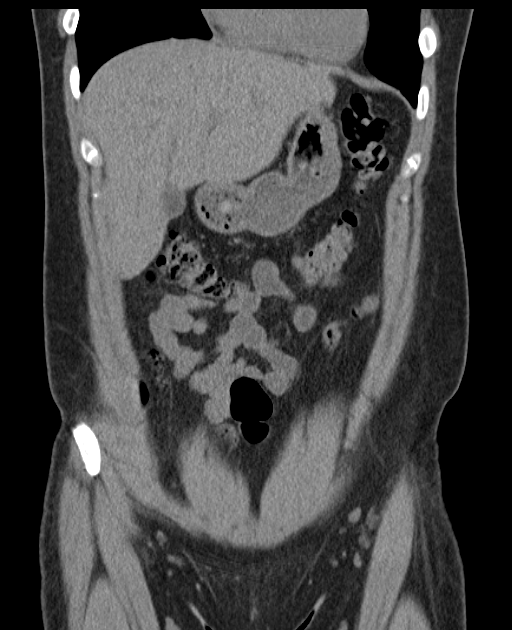
[im 36/80  soft-tissue]
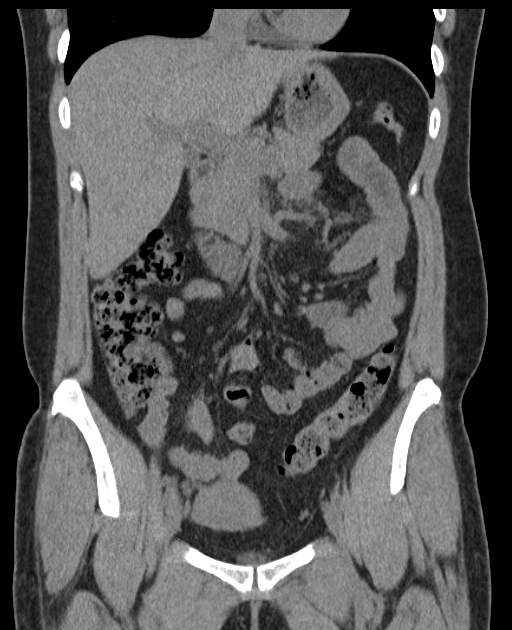
[im 36/80  bone]
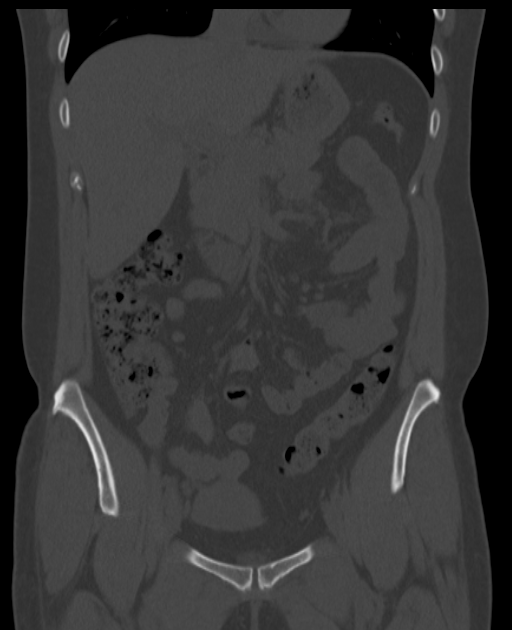
[im 44/80  soft-tissue]
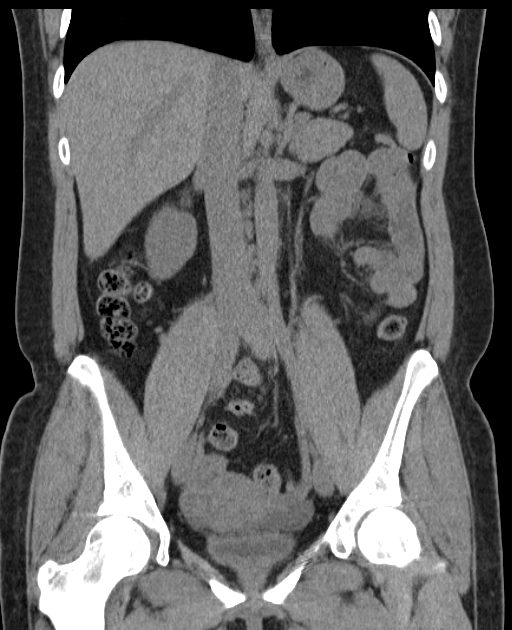

[Series 5: mpr sagittal (id) · sagittal · 0.50mm/px · 1 of 105 slices shown, 2 images]
[im 35/105  soft-tissue]
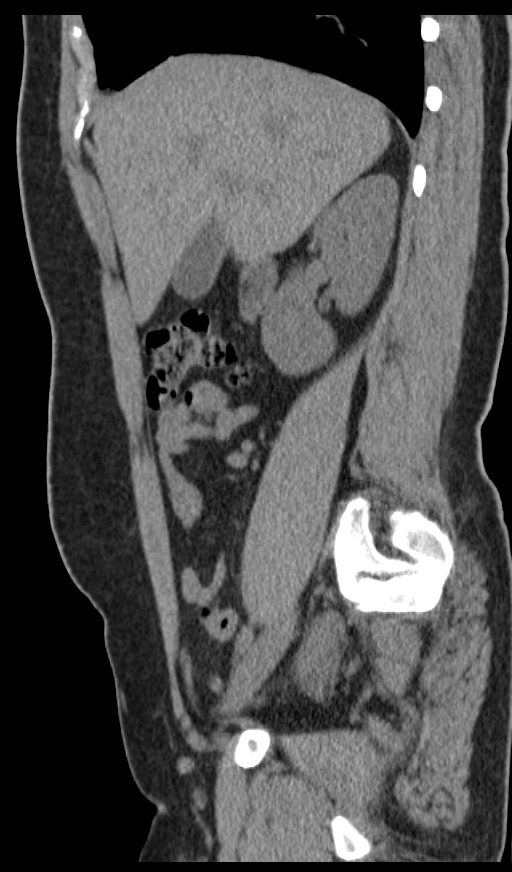
[im 35/105  bone]
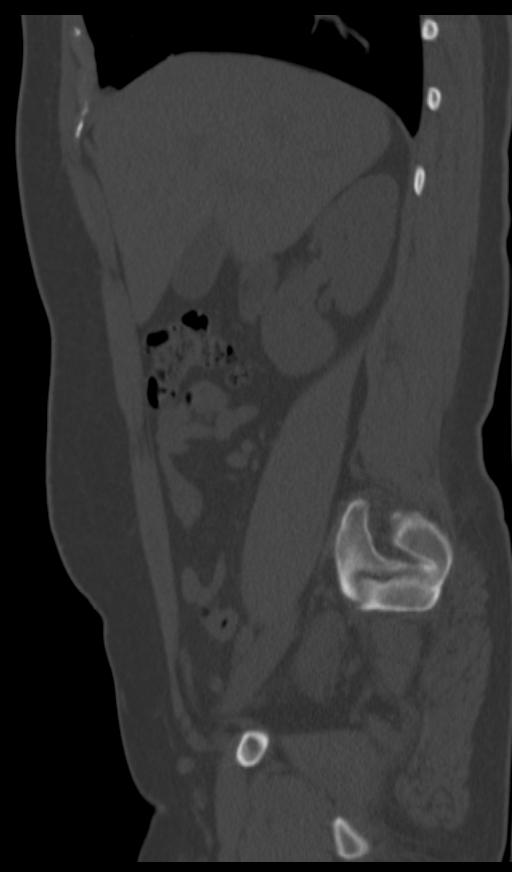

[8 of 46 positions shown; findings below may reference images not displayed]

FINDINGS: The lung bases are clear. There is no pleural or pericardial
effusion.

The kidneys appear normal bilaterally. There is no hydronephrosis.
No urinary tract stones are present. Urinary bladder, uterus and
adnexa are unremarkable.

The gallbladder, liver, adrenal glands, spleen and pancreas appear
normal. The stomach, small and large bowel and appendix are
unremarkable. No lymphadenopathy or fluid.

No focal bony abnormality is identified.
IMPRESSION: Negative for urinary tract stone.  Negative CT abdomen and pelvis.

## 2017-12-11 ENCOUNTER — Encounter: Payer: Self-pay | Admitting: Family Medicine

## 2017-12-11 NOTE — Telephone Encounter (Signed)
Nurses- please go ahead with a 30-day prescription of her ADD medicine.  Verify with pharmacy when her last prescription was filled the next one can be filled 30 days later.  Have the patient keep follow-up visit in early September.

## 2017-12-11 NOTE — Telephone Encounter (Signed)
Pt wants refill on adderall. Last ADD check up was 09/06/17. Has upcoming appt on 01/09/18

## 2017-12-12 ENCOUNTER — Other Ambulatory Visit: Payer: Self-pay | Admitting: *Deleted

## 2017-12-12 MED ORDER — AMPHETAMINE-DEXTROAMPHET ER 20 MG PO CP24: 20.0000 mg | ORAL_CAPSULE | Freq: Every day | ORAL | 0 refills | Status: DC

## 2018-01-09 ENCOUNTER — Encounter: Payer: Self-pay | Admitting: Family Medicine

## 2018-01-09 ENCOUNTER — Ambulatory Visit (INDEPENDENT_AMBULATORY_CARE_PROVIDER_SITE_OTHER): Payer: Self-pay | Admitting: Family Medicine

## 2018-01-09 VITALS — BP 118/78 | Ht 65.0 in | Wt 165.4 lb

## 2018-01-09 DIAGNOSIS — F988 Other specified behavioral and emotional disorders with onset usually occurring in childhood and adolescence: Secondary | ICD-10-CM

## 2018-01-09 MED ORDER — AMPHETAMINE-DEXTROAMPHET ER 20 MG PO CP24: 20.0000 mg | ORAL_CAPSULE | Freq: Every day | ORAL | 0 refills | Status: DC

## 2018-01-09 MED ORDER — SERTRALINE HCL 50 MG PO TABS: 100.0000 mg | ORAL_TABLET | Freq: Every day | ORAL | 6 refills | Status: DC

## 2018-01-09 NOTE — Progress Notes (Signed)
   Subjective:    Patient ID: Adrienne Church, female    DOB: 1986-04-23, 32 y.o.   MRN: [id number]  Anxiety  Presents for follow-up visit. Patient reports no chest pain. The quality of sleep is good.    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  This patient does have ADD.  Patient takes medications for this.  If this does help control overall symptoms.  Please see below. -weight, vital signs reviewed.  The following items were covered. -Compliance with medication : yes  -Problems with completing homework, paying attention/taking good notes in school: none  -grades: none  - Eating patterns : eats good  -sleeping: good  -Additional issues or questions: Pt states that she gets up early and has to take med around 11 or 12.    Review of Systems  Constitutional: Negative for activity change, appetite change and fatigue.  HENT: Negative for congestion.   Respiratory: Negative for cough.   Cardiovascular: Negative for chest pain.  Gastrointestinal: Negative for abdominal pain.  Skin: Negative for color change.  Neurological: Negative for headaches.  Psychiatric/Behavioral: Negative for behavioral problems.       Objective:   Physical Exam  Constitutional: She appears well-nourished. No distress.  HENT:  Head: Normocephalic and atraumatic.  Eyes: Right eye exhibits no discharge. Left eye exhibits no discharge.  Neck: No tracheal deviation present.  Cardiovascular: Normal rate, regular rhythm and normal heart sounds.  No murmur heard. Pulmonary/Chest: Effort normal and breath sounds normal. No respiratory distress.  Musculoskeletal: She exhibits no edema.  Lymphadenopathy:    She has no cervical adenopathy.  Neurological: She is alert. Coordination  normal.  Skin: Skin is warm and dry.  Psychiatric: She has a normal mood and affect. Her behavior is normal.  Vitals reviewed.         Assessment & Plan:  ADD Overall doing well coping well with medication helps greatly 3 prescriptions were given Drug registry was checked Stick with current dose  Underlying anxiety issues does well on Zoloft 50 mg daily new prescription was given patient will follow-up in 3 months regarding the ADD

## 2018-02-04 ENCOUNTER — Encounter: Payer: Self-pay | Admitting: Family Medicine

## 2018-02-04 NOTE — Telephone Encounter (Signed)
No physical in epic. Last seen 01/09/18 for anxiety.

## 2018-04-12 ENCOUNTER — Ambulatory Visit (INDEPENDENT_AMBULATORY_CARE_PROVIDER_SITE_OTHER): Payer: BLUE CROSS/BLUE SHIELD | Admitting: Family Medicine

## 2018-04-12 ENCOUNTER — Encounter: Payer: Self-pay | Admitting: Family Medicine

## 2018-04-12 VITALS — BP 118/78 | Ht 65.0 in | Wt 167.0 lb

## 2018-04-12 DIAGNOSIS — F411 Generalized anxiety disorder: Secondary | ICD-10-CM

## 2018-04-12 DIAGNOSIS — F988 Other specified behavioral and emotional disorders with onset usually occurring in childhood and adolescence: Secondary | ICD-10-CM | POA: Diagnosis not present

## 2018-04-12 MED ORDER — AMPHETAMINE-DEXTROAMPHET ER 20 MG PO CP24: 20.0000 mg | ORAL_CAPSULE | Freq: Every day | ORAL | 0 refills | Status: DC

## 2018-04-12 MED ORDER — SERTRALINE HCL 50 MG PO TABS: 100.0000 mg | ORAL_TABLET | Freq: Every day | ORAL | 6 refills | Status: DC

## 2018-04-12 MED ORDER — AMPHETAMINE-DEXTROAMPHET ER 20 MG PO CP24: 20.0000 mg | ORAL_CAPSULE | ORAL | 0 refills | Status: DC

## 2018-04-12 NOTE — Progress Notes (Signed)
   Subjective:    Patient ID: Adrienne Church, female    DOB: 12/26/1985, 32 y.o.   MRN: [id number]  HPI Patient arrives for a follow up on ADHD. Patient currently on Adderall XR 20mg  daily. Patient states she is doing well on current meds.  She does have some mild anxiety issues medication is helping she is continuing the medication. Patient with adult ADD Takes medication on a daily basis It does help her focus She denies it causing problems She is hoping to transition to 1 of the medicines that lasts for 12 to 16 hours she will let us know if she is able to get this with her insurance Review of Systems  Constitutional: Negative for activity change, appetite change and fatigue.  HENT: Negative for congestion.   Respiratory: Negative for cough.   Cardiovascular: Negative for chest pain.  Gastrointestinal: Negative for abdominal pain.  Skin: Negative for color change.  Neurological: Negative for headaches.  Psychiatric/Behavioral: Negative for behavioral problems.       Objective:   Physical Exam  Constitutional: She appears well-developed and well-nourished.  HENT:  Head: Normocephalic.  Cardiovascular: Normal rate, regular rhythm and normal heart sounds.  No murmur heard. Pulmonary/Chest: Effort normal and breath sounds normal.  Neurological: She is alert.  Skin: Skin is warm and dry.  Psychiatric: She has a normal mood and affect.  Vitals reviewed.  Patient denies side effects from medicine  Drug registry was checked     Assessment & Plan:  Adult ADD Prescriptions were sent into the pharmacy She was encouraged to follow-up if progressive troubles or worse  She will follow-up in 3 months Anxiety related issues overall doing well with medication continue medication.

## 2018-04-22 ENCOUNTER — Encounter: Payer: Self-pay | Admitting: Family Medicine

## 2018-04-25 ENCOUNTER — Ambulatory Visit: Payer: BLUE CROSS/BLUE SHIELD | Admitting: Family Medicine

## 2018-05-22 ENCOUNTER — Other Ambulatory Visit: Payer: Self-pay | Admitting: Family Medicine

## 2018-06-11 ENCOUNTER — Encounter: Payer: Self-pay | Admitting: Family Medicine

## 2018-06-11 ENCOUNTER — Ambulatory Visit (INDEPENDENT_AMBULATORY_CARE_PROVIDER_SITE_OTHER): Payer: Self-pay | Admitting: Family Medicine

## 2018-06-11 VITALS — BP 132/80 | Temp 99.1°F | Ht 65.0 in | Wt 169.0 lb

## 2018-06-11 DIAGNOSIS — J111 Influenza due to unidentified influenza virus with other respiratory manifestations: Secondary | ICD-10-CM

## 2018-06-11 MED ORDER — OSELTAMIVIR PHOSPHATE 75 MG PO CAPS: 75.0000 mg | ORAL_CAPSULE | Freq: Two times a day (BID) | ORAL | 0 refills | Status: AC

## 2018-06-11 NOTE — Patient Instructions (Signed)

## 2018-06-11 NOTE — Progress Notes (Signed)
   Subjective:    Patient ID: Adrienne Church, female    DOB: [birth year], 33 y.o.   MRN: [id number]  Cough  This is a new problem. Episode onset: 2 days. Associated symptoms include a fever, headaches, a sore throat and wheezing. Associated symptoms comments: Vomiting, burning in chest, sinus pressure. Treatments tried: tylenol, mucinex, flonase, cough drops, delsym.   Sunday afternoon started with dry cough. Yesterday started with fever and worsening cough, as well as fever, body aches, chills and headache. Today cough is worsening.    Review of Systems  Constitutional: Positive for fever.  HENT: Positive for sore throat.   Respiratory: Positive for cough and wheezing.   Neurological: Positive for headaches.        Objective:   Physical Exam Vitals signs and nursing note reviewed.  Constitutional:      General: She is not in acute distress.    Appearance: Normal appearance. She is not toxic-appearing.  HENT:     Head: Normocephalic and atraumatic.     Right Ear: Tympanic membrane normal.     Left Ear: Tympanic membrane normal.     Nose: Nose normal.     Mouth/Throat:     Mouth: Mucous membranes are moist.     Pharynx: Oropharynx is clear.  Eyes:     General:        Right eye: No discharge.        Left eye: No discharge.  Neck:     Musculoskeletal: Neck supple. No neck rigidity.  Cardiovascular:     Rate and Rhythm: Normal rate and regular rhythm.     Heart sounds: Normal heart sounds.  Pulmonary:     Effort: Pulmonary effort is normal. No respiratory distress.     Breath sounds: Normal breath sounds. No wheezing or rales.  Lymphadenopathy:     Cervical: No cervical adenopathy.  Skin:    General: Skin is warm and dry.  Neurological:     Mental Status: She is alert and oriented to person, place, and time.           Assessment & Plan:  Influenza  Discussed likely viral etiology, likely flu diagnosis based on symptoms.  Since she is within the 48-hour window  will treat with Tamiflu.  Symptomatic care discussed.  Warning signs discussed.  Follow-up if symptoms worsen or fail to improve.

## 2018-06-12 ENCOUNTER — Encounter: Payer: Self-pay | Admitting: Family Medicine

## 2018-06-12 ENCOUNTER — Other Ambulatory Visit: Payer: Self-pay | Admitting: Family Medicine

## 2018-06-12 MED ORDER — HYDROCODONE-HOMATROPINE 5-1.5 MG/5ML PO SYRP: 5.0000 mL | ORAL_SOLUTION | Freq: Four times a day (QID) | ORAL | 0 refills | Status: AC | PRN

## 2018-06-17 ENCOUNTER — Encounter: Payer: Self-pay | Admitting: Family Medicine

## 2018-06-29 IMAGING — US US ABDOMEN LIMITED
1 series · 14 of 25 positions shown · non-contrast
Comparison: None.

CLINICAL DATA: Abdominal pain for 1 day.

EXAM:
US ABDOMEN LIMITED - RIGHT UPPER QUADRANT

[Series 1: us abdomen limited · 0.15mm/px · 14 of 48 slices shown]
[im 1/48]
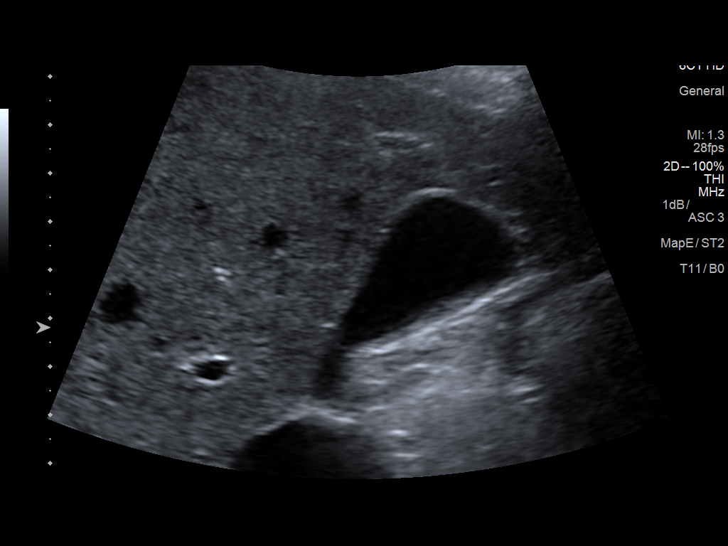
[im 4/48]
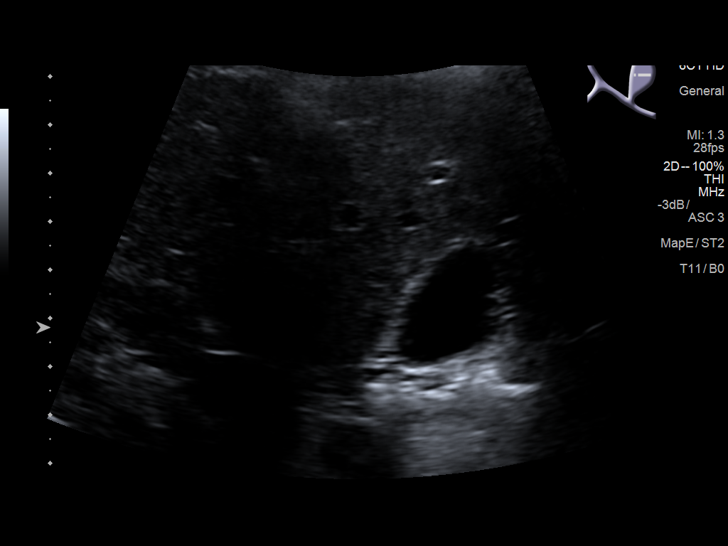
[im 8/48]
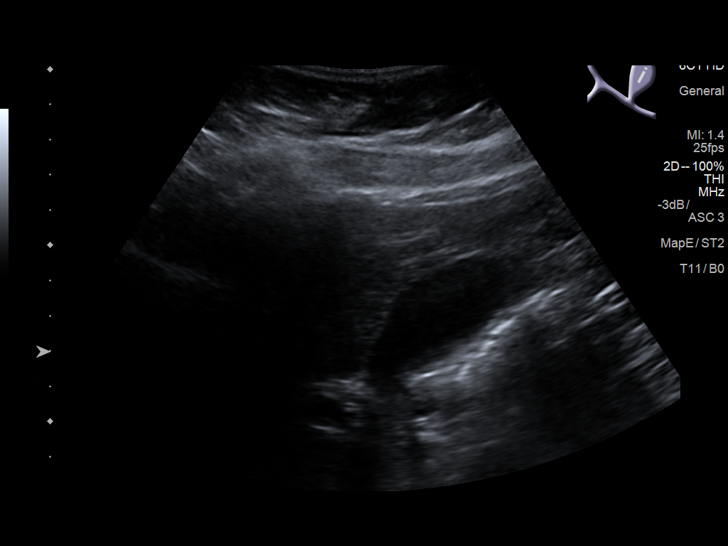
[im 12/48]
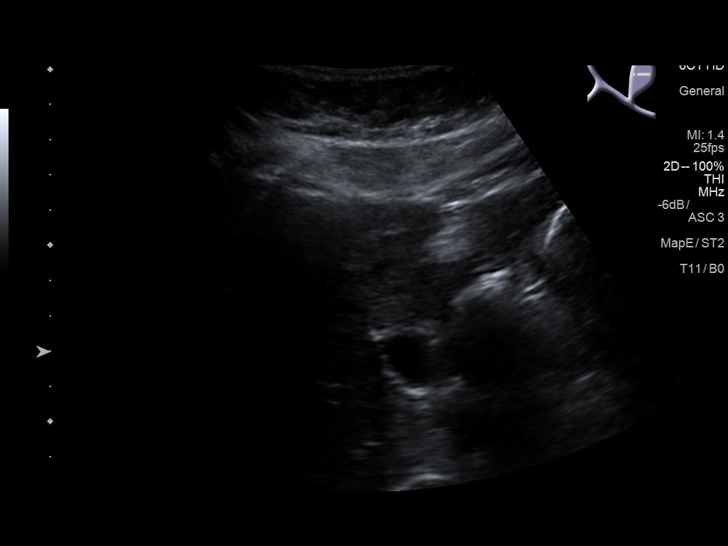
[im 16/48]
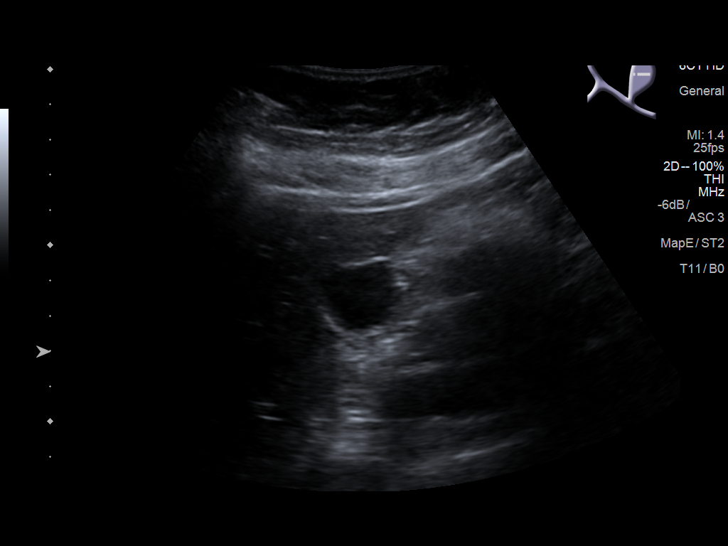
[im 18/48]
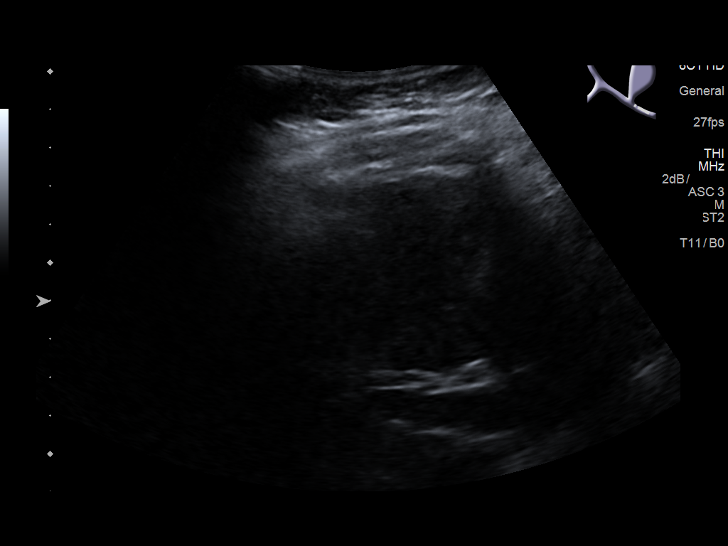
[im 22/48]
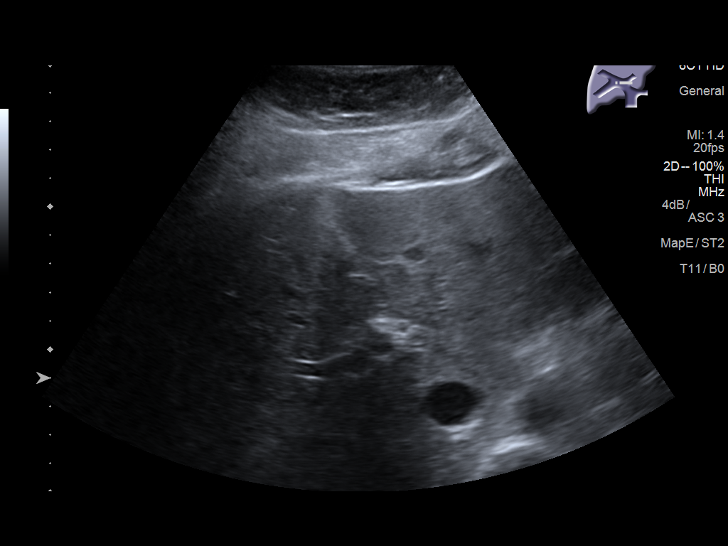
[im 26/48]
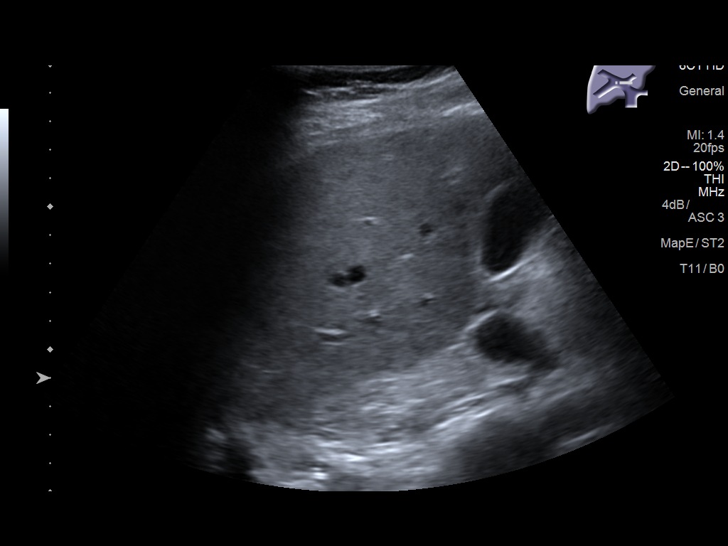
[im 30/48]
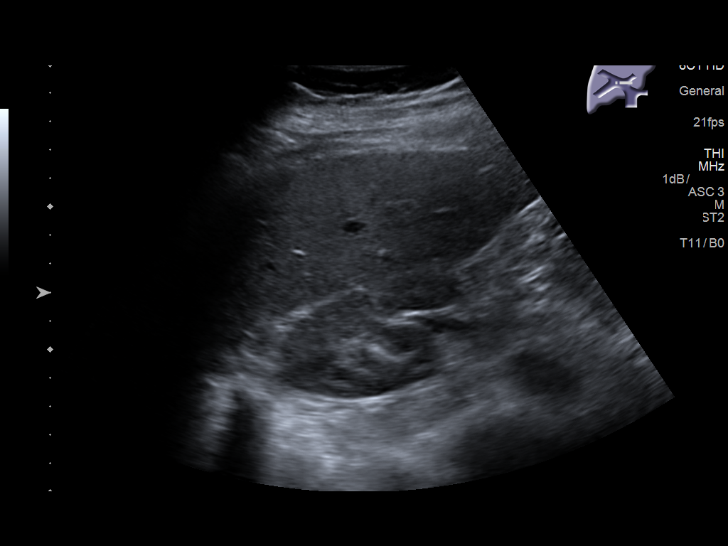
[im 32/48]
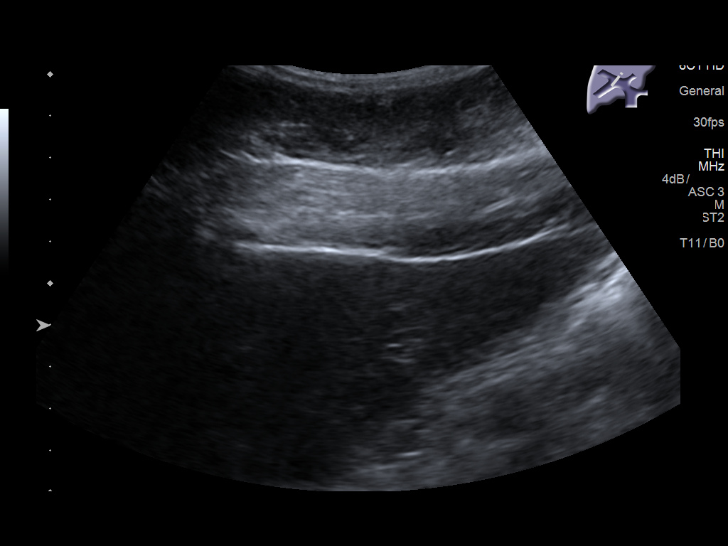
[im 36/48]
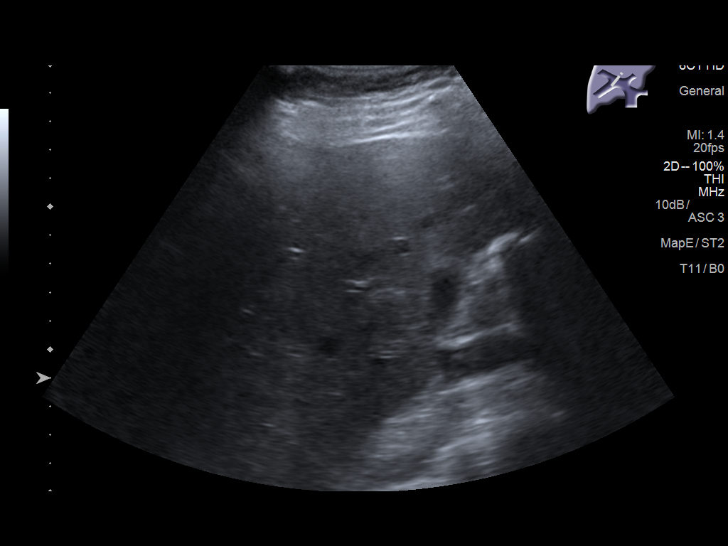
[im 40/48]
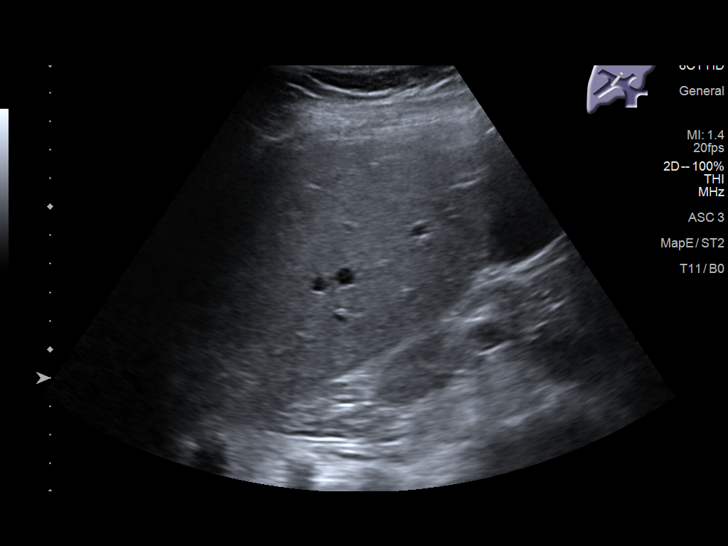
[im 44/48]
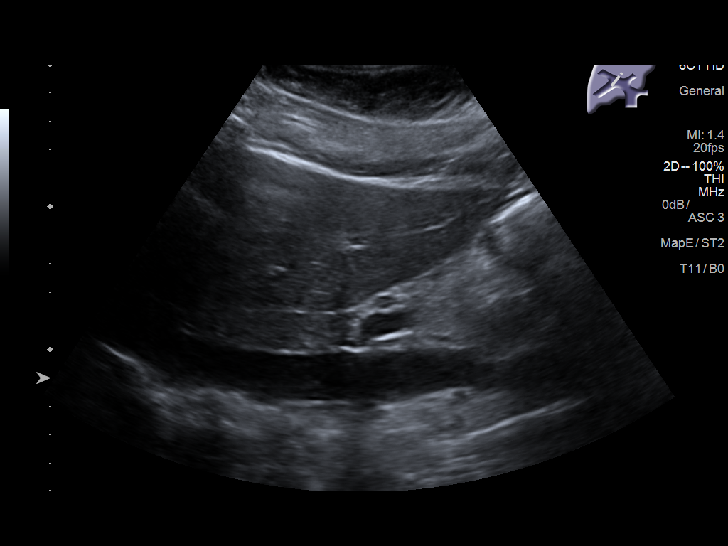
[im 48/48]
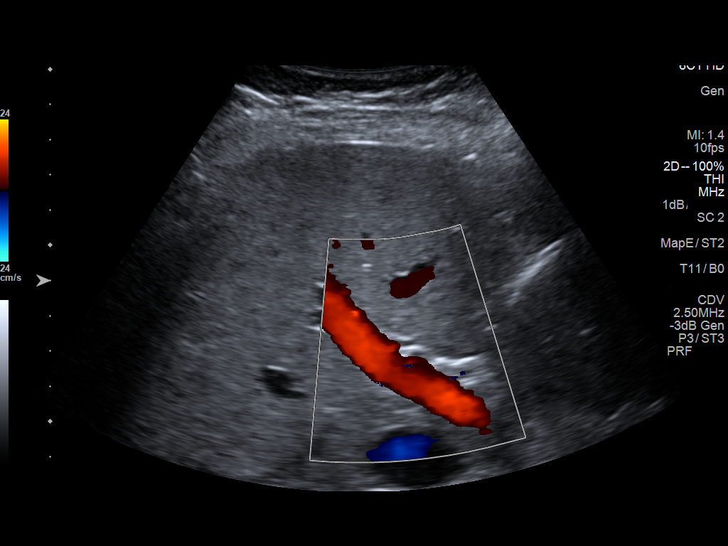

[14 of 25 positions shown; findings below may reference images not displayed]

FINDINGS: Gallbladder:

No gallstones or wall thickening visualized. No pericholecystic
fluid.

Common bile duct:

Diameter: 2mm

Liver:

No focal lesion identified. Within normal limits in parenchymal
echogenicity.
IMPRESSION: Normal liver, gallbladder and bile ducts.

## 2018-07-10 ENCOUNTER — Ambulatory Visit (INDEPENDENT_AMBULATORY_CARE_PROVIDER_SITE_OTHER): Payer: Self-pay | Admitting: Family Medicine

## 2018-07-10 ENCOUNTER — Encounter: Payer: Self-pay | Admitting: Family Medicine

## 2018-07-10 VITALS — BP 102/84 | Ht 65.0 in | Wt 167.0 lb

## 2018-07-10 DIAGNOSIS — F988 Other specified behavioral and emotional disorders with onset usually occurring in childhood and adolescence: Secondary | ICD-10-CM

## 2018-07-10 DIAGNOSIS — F411 Generalized anxiety disorder: Secondary | ICD-10-CM

## 2018-07-10 MED ORDER — AMPHETAMINE-DEXTROAMPHET ER 20 MG PO CP24: 20.0000 mg | ORAL_CAPSULE | Freq: Every day | ORAL | 0 refills | Status: DC

## 2018-07-10 MED ORDER — AMPHETAMINE-DEXTROAMPHET ER 20 MG PO CP24: 20.0000 mg | ORAL_CAPSULE | ORAL | 0 refills | Status: DC

## 2018-07-10 NOTE — Progress Notes (Signed)
   Subjective:    Patient ID: Adrienne Church, female    DOB: 1986/05/03, 33 y.o.   MRN: [id number]  HPI  Patient is here today to follow up on her chronic heath issues and Add. She takes her medicine on a regular basis states overall things are going well stress levels doing well denies being depressed Gad: Takes zoloft 50 mg one Qhs  Patient was seen today for ADD checkup.  This patient does have ADD.  Patient takes medications for this.  If this does help control overall symptoms.  Please see below. -weight, vital signs reviewed.  The following items were covered. -Compliance with medication : Adderall xr 20 mg daily  -Problems with completing homework, paying attention/taking good notes in school: No  -grades: N/A  - Eating patterns : Good  -sleeping: Good  -Additional issues or questions: still has a cough productive with green mucus.No fevers.  Review of Systems  Constitutional: Negative for activity change and appetite change.  HENT: Negative for congestion and rhinorrhea.   Respiratory: Negative for cough and shortness of breath.   Cardiovascular: Negative for chest pain and leg swelling.  Gastrointestinal: Negative for abdominal pain, nausea and vomiting.  Skin: Negative for color change.  Neurological: Negative for dizziness and weakness.  Psychiatric/Behavioral: Negative for agitation and confusion.       Objective:   Physical Exam Vitals signs reviewed.  Constitutional:      General: She is not in acute distress. HENT:     Head: Normocephalic and atraumatic.  Eyes:     General:        Right eye: No discharge.        Left eye: No discharge.  Neck:     Trachea: No tracheal deviation.  Cardiovascular:     Rate and Rhythm: Normal rate and regular rhythm.     Heart sounds: Normal heart sounds. No murmur.  Pulmonary:     Effort: Pulmonary effort is normal. No respiratory distress.     Breath sounds: Normal breath sounds.  Lymphadenopathy:     Cervical:  No cervical adenopathy.  Skin:    General: Skin is warm and dry.  Neurological:     Mental Status: She is alert.     Coordination: Coordination normal.  Psychiatric:        Behavior: Behavior normal.           Assessment & Plan:  Adult ADD Doing well with medicine Continue current measures Follow-up if progressive troubles Warning signs discussed in detail We will send in 3 scripts today She will call us when she is getting 40 ended that we will send in one additional prescription with her doing a follow-up visit in approximately 3 to 4 months   GAD-denies being depressed doing well with sertraline continue this currently.  No labs indicated today

## 2018-07-10 NOTE — Telephone Encounter (Signed)
Note sent to nurse. 

## 2018-08-02 ENCOUNTER — Encounter: Payer: Self-pay | Admitting: Family Medicine

## 2018-08-26 ENCOUNTER — Encounter: Payer: Self-pay | Admitting: Family Medicine

## 2018-08-27 ENCOUNTER — Other Ambulatory Visit: Payer: Self-pay | Admitting: Family Medicine

## 2018-08-27 MED ORDER — NORGESTIM-ETH ESTRAD TRIPHASIC 0.18/0.215/0.25 MG-25 MCG PO TABS: 1.0000 | ORAL_TABLET | Freq: Every day | ORAL | 5 refills | Status: DC

## 2018-09-17 ENCOUNTER — Encounter: Payer: Self-pay | Admitting: Family Medicine

## 2018-09-18 MED ORDER — SILVER SULFADIAZINE 1 % EX CREA: TOPICAL_CREAM | CUTANEOUS | 0 refills | Status: DC

## 2018-09-18 NOTE — Addendum Note (Signed)
Addended by: Marlowe Shores on: 09/18/2018 11:18 AM   Modules accepted: Orders

## 2018-09-18 NOTE — Telephone Encounter (Signed)
Silvadene cream would be fine to help Send in 50 g tub Apply daily till healed May use ibuprofen for discomfort Avoid excessive sunlight Call us if any ongoing troubles

## 2018-10-29 ENCOUNTER — Telehealth: Payer: Self-pay | Admitting: Family Medicine

## 2018-10-29 ENCOUNTER — Other Ambulatory Visit: Payer: Self-pay | Admitting: Family Medicine

## 2018-10-29 MED ORDER — AMPHETAMINE-DEXTROAMPHET ER 20 MG PO CP24: 20.0000 mg | ORAL_CAPSULE | ORAL | 0 refills | Status: DC

## 2018-10-29 NOTE — Telephone Encounter (Signed)
Refill was sent in keep follow-up appointment in July

## 2018-10-29 NOTE — Telephone Encounter (Signed)
Pt has med check scheduled for 7/2.   She is needing a refill on amphetamine-dextroamphetamine (ADDERALL XR) 20 MG 24 hr capsule  Please send to [place], [address]

## 2018-11-07 ENCOUNTER — Other Ambulatory Visit: Payer: Self-pay

## 2018-11-07 ENCOUNTER — Ambulatory Visit (INDEPENDENT_AMBULATORY_CARE_PROVIDER_SITE_OTHER): Payer: Self-pay | Admitting: Family Medicine

## 2018-11-07 DIAGNOSIS — F411 Generalized anxiety disorder: Secondary | ICD-10-CM

## 2018-11-07 DIAGNOSIS — F988 Other specified behavioral and emotional disorders with onset usually occurring in childhood and adolescence: Secondary | ICD-10-CM

## 2018-11-07 MED ORDER — AMPHETAMINE-DEXTROAMPHET ER 20 MG PO CP24: 20.0000 mg | ORAL_CAPSULE | ORAL | 0 refills | Status: DC

## 2018-11-07 MED ORDER — ALPRAZOLAM 0.5 MG PO TABS: ORAL_TABLET | ORAL | 0 refills | Status: DC

## 2018-11-07 MED ORDER — AMPHETAMINE-DEXTROAMPHET ER 20 MG PO CP24: 20.0000 mg | ORAL_CAPSULE | Freq: Every day | ORAL | 0 refills | Status: DC

## 2018-11-07 NOTE — Progress Notes (Signed)
   Subjective:    Patient ID: Adrienne Church, female    DOB: 1985/08/26, 33 y.o.   MRN: [id number]  HPI  Patient calls for an ADHD follow up. Patient states her meds are working well and she is having no problems or concerns. Patient denies any high fever chills sweats denies any health issues states her stress levels overall are doing fairly good but occasionally has panicky feeling she states she used to take Xanax rarely she is requesting a prescription she denies abusing it having some stress in the marriage denies being depressed currently Virtual Visit via Video Note  I connected with Adrienne Church on 11/07/18 at  3:30 PM EDT by a video enabled telemedicine application and verified that I am speaking with the correct person using two identifiers.  Location: Patient: home Provider: office   I discussed the limitations of evaluation and management by telemedicine and the availability of in person appointments. The patient expressed understanding and agreed to proceed.  History of Present Illness:    Observations/Objective:   Assessment and Plan:   Follow Up Instructions:    I discussed the assessment and treatment plan with the patient. The patient was provided an opportunity to ask questions and all were answered. The patient agreed with the plan and demonstrated an understanding of the instructions.   The patient was advised to call back or seek an in-person evaluation if the symptoms worsen or if the condition fails to improve as anticipated.  I provided 15 minutes of non-face-to-face time during this encounter.     Review of Systems  Constitutional: Negative for activity change, appetite change and fatigue.  HENT: Negative for congestion.   Respiratory: Negative for cough.   Cardiovascular: Negative for chest pain.  Gastrointestinal: Negative for abdominal pain.  Skin: Negative for color change.  Neurological: Negative for headaches.  Psychiatric/Behavioral:  Negative for behavioral problems.       Objective:   Physical Exam  Patient had virtual visit Appears to be in no distress Atraumatic Neuro able to relate and oriented No apparent resp distress Color normal       Assessment & Plan:  Generalized anxiety disorder continue the Zoloft use Xanax rarely follow-up if progressive troubles or worse  Adult ADD doing well with medicine 3 prescription sent in follow-up in approximately 3 to 4 months sooner if any problems

## 2018-11-17 IMAGING — CT CT ABD-PELV W/ CM
2 of 4 series · 15 of 46 positions shown, 17 images · IV contrast (APPLIED)
Comparison: Abdominal ultrasound May 08, 2016 at 0636 hours and
CT abdomen and pelvis October 08, 2010 fell

CLINICAL DATA: Woke up with severe RIGHT abdominal pain. Vomiting
for 2 days. History of kidney stones.

EXAM:
CT ABDOMEN AND PELVIS WITH CONTRAST
TECHNIQUE: Multidetector CT imaging of the abdomen and pelvis was performed
using the standard protocol following bolus administration of
intravenous contrast.
CONTRAST:  100mL WT151Q-KQQ IOPAMIDOL (WT151Q-KQQ) INJECTION 61%

[Series 2: abd/ pelvis 5.0 i30f 1 · axial · 0.66mm/px · z∈[-1082,-712]mm · 12 of 88 slices shown, 14 images]
[im 7/88  soft-tissue]
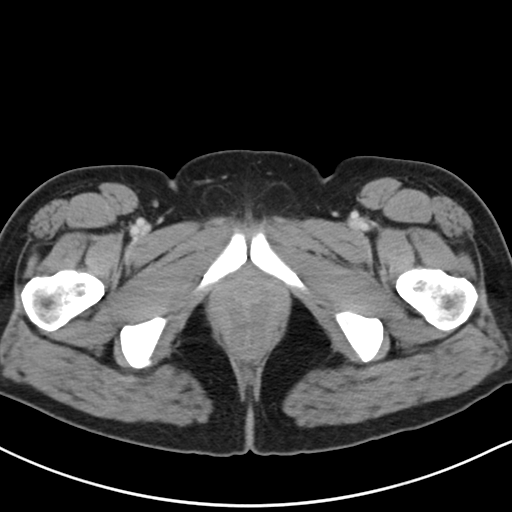
[im 7/88  bone]
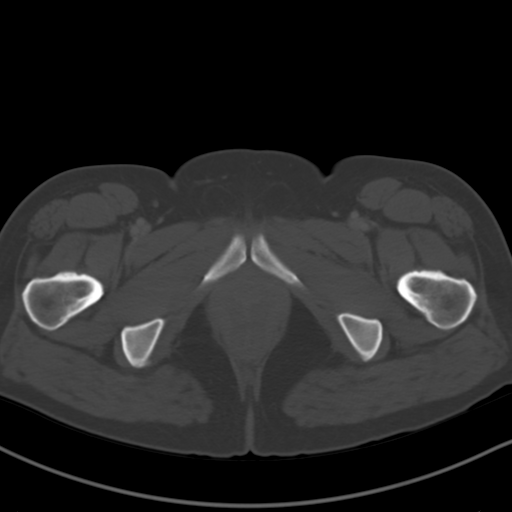
[im 14/88  soft-tissue]
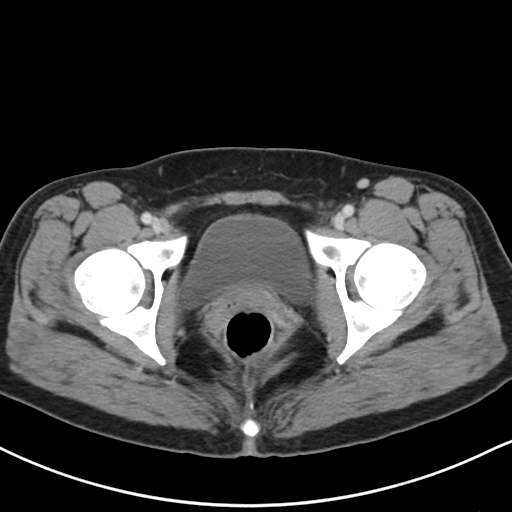
[im 21/88  soft-tissue]
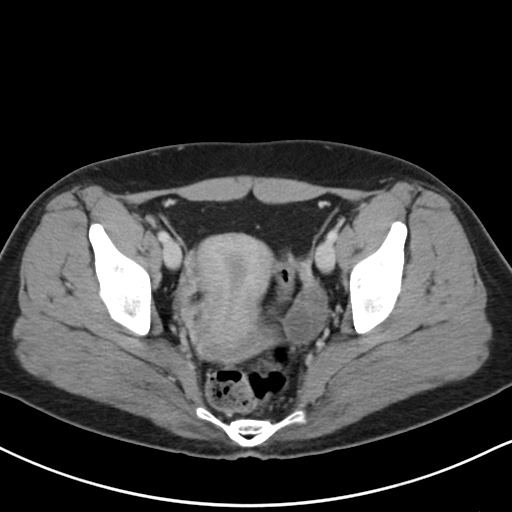
[im 27/88  soft-tissue]
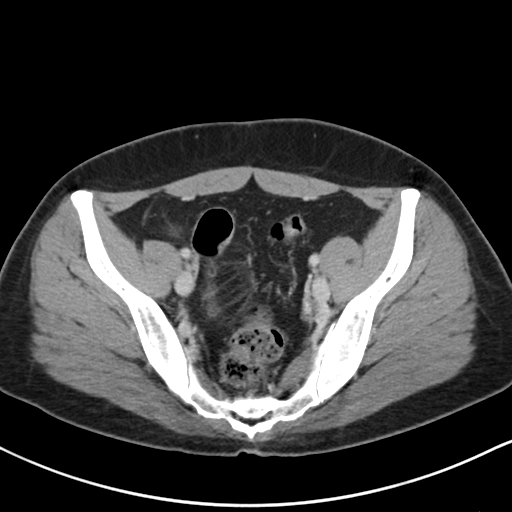
[im 34/88  soft-tissue]
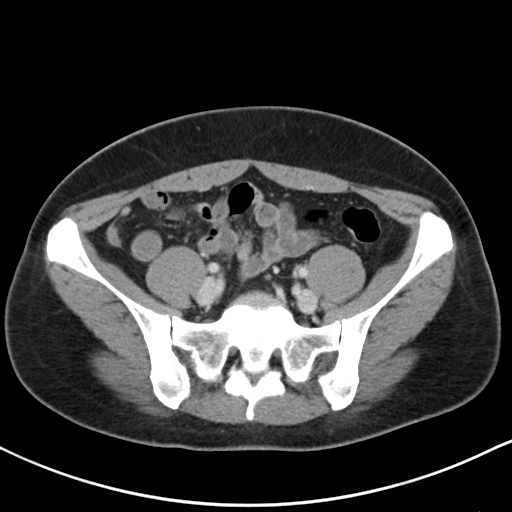
[im 41/88  soft-tissue]
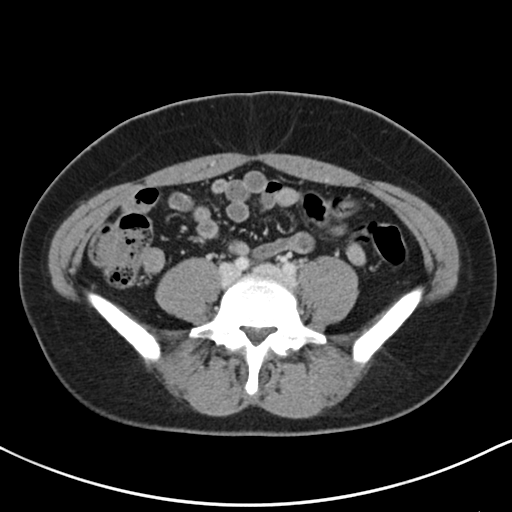
[im 47/88  soft-tissue]
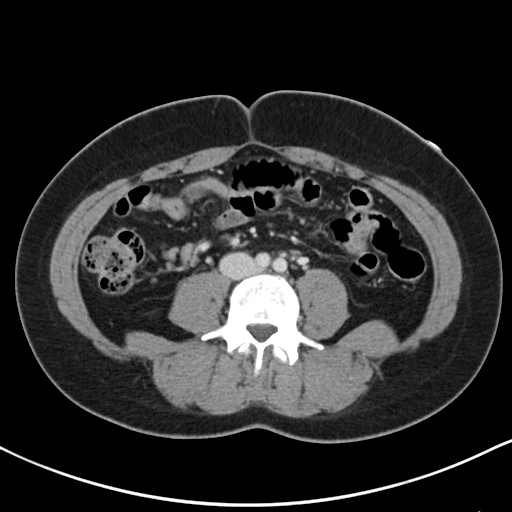
[im 54/88  soft-tissue]
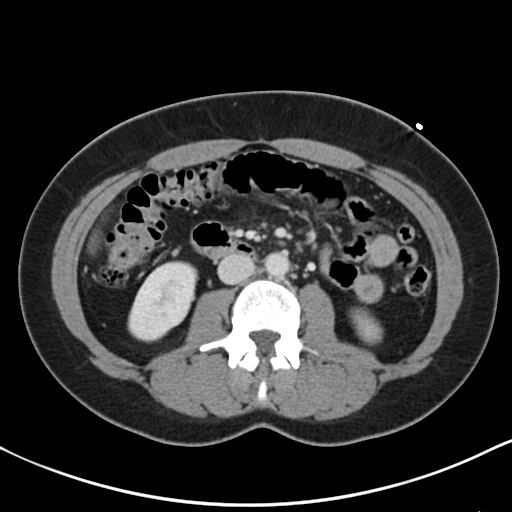
[im 61/88  soft-tissue]
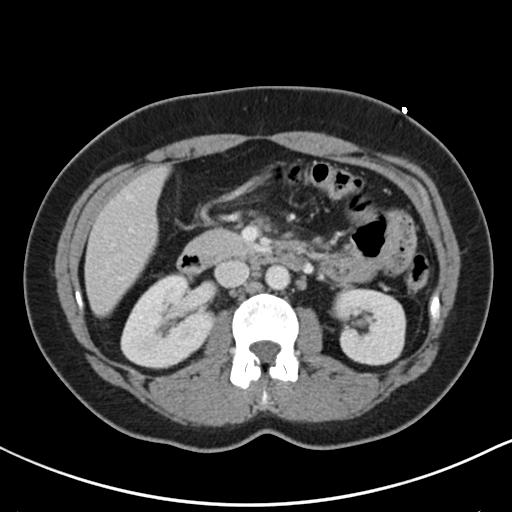
[im 61/88  bone]
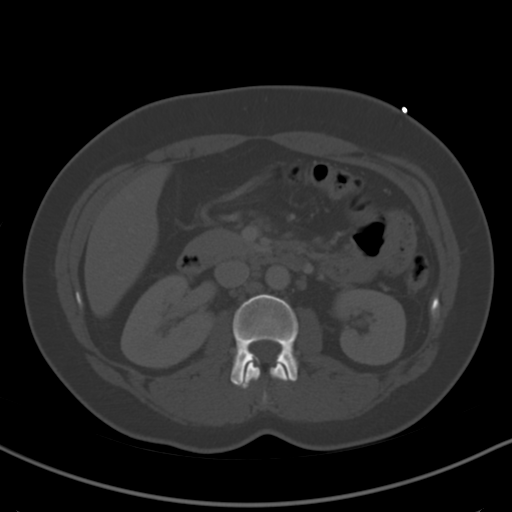
[im 67/88  soft-tissue]
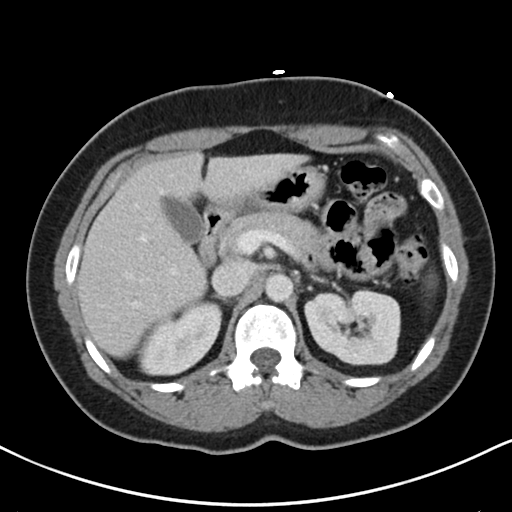
[im 74/88  soft-tissue]
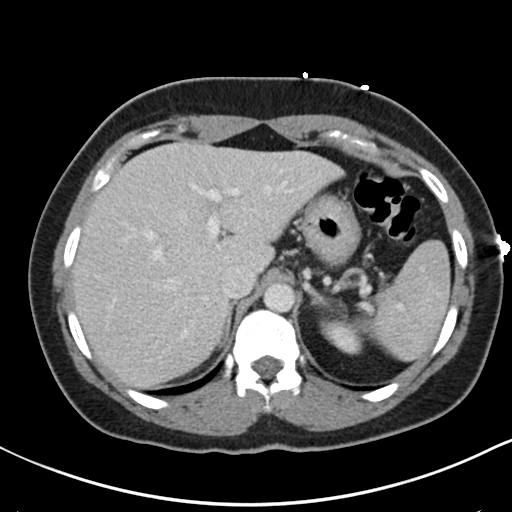
[im 81/88  soft-tissue]
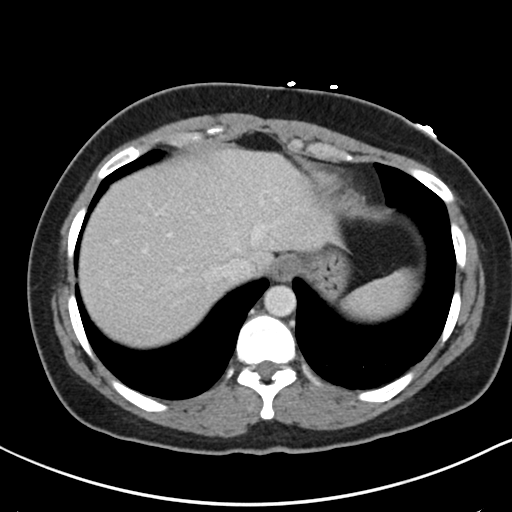

[Series 4: coronal soft tissue · coronal · 0.63mm/px · 3 of 79 slices shown]
[im 27/79  soft-tissue]
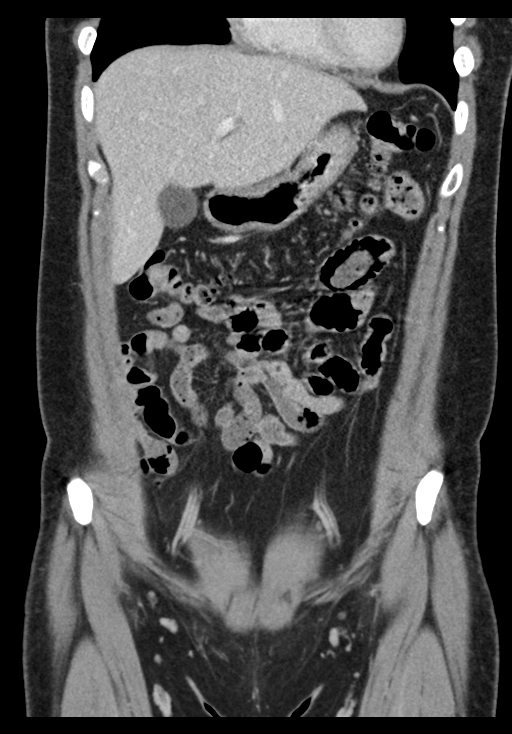
[im 35/79  soft-tissue]
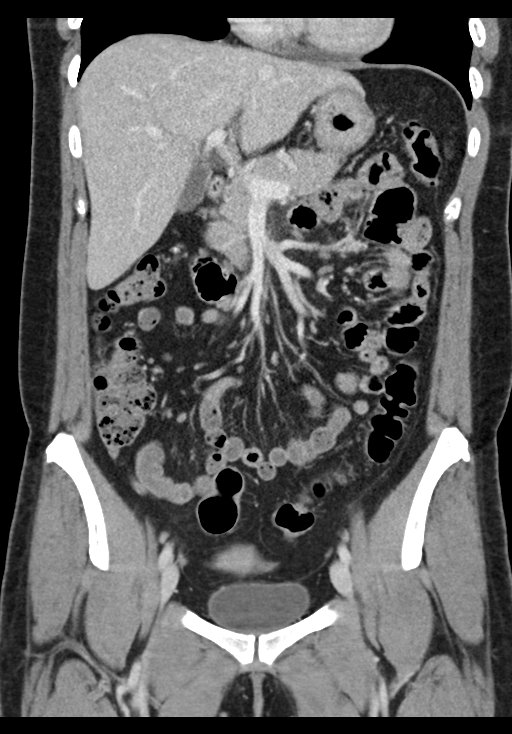
[im 44/79  soft-tissue]
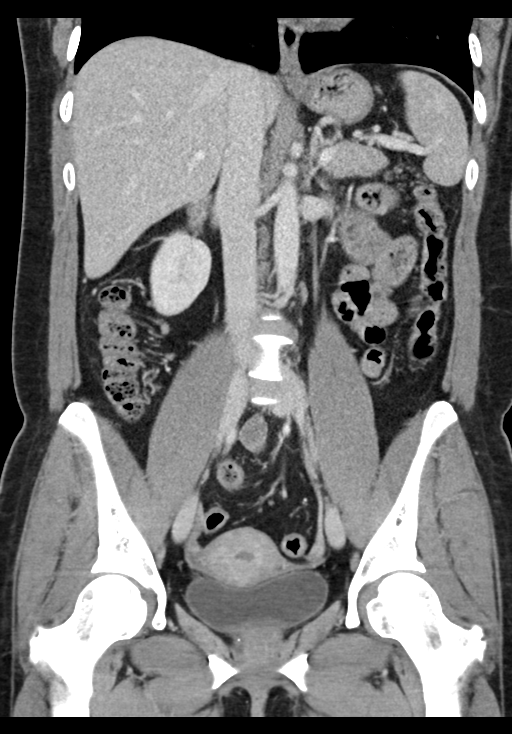

[15 of 46 positions shown; findings below may reference images not displayed]

FINDINGS: LOWER CHEST: Lung bases are clear. Included heart size is normal. No
pericardial effusion.

HEPATOBILIARY: Liver and gallbladder are normal.

PANCREAS: Normal.

SPLEEN: Normal.

ADRENALS/URINARY TRACT: Kidneys are orthotopic, demonstrating
symmetric enhancement. No nephrolithiasis, hydronephrosis or solid
renal masses. The unopacified ureters are normal in course and
caliber. Delayed imaging through the kidneys demonstrates symmetric
prompt contrast excretion within the proximal urinary collecting
system. Urinary bladder is partially distended and unremarkable.
Normal adrenal glands.

STOMACH/BOWEL: The stomach, small and large bowel are normal in
course and caliber without inflammatory changes. Small amount of
small bowel feces compatible chronic stasis. Normal appendix.

VASCULAR/LYMPHATIC: Aortoiliac vessels are normal in course and
caliber. No lymphadenopathy by CT size criteria. Tiny mesenteric
lymph nodes are likely reactive. Mild "misty" mesentery, decreased.

REPRODUCTIVE: Normal.  LEFT dominant follicle.

OTHER: Trace amount of free fluid in the pelvis is likely
physiologic. No intraperitoneal free air or focal fluid collections.

MUSCULOSKELETAL: Nonacute. Small L4-5 broad-based disc bulge, small
L5-S1 broad-based disc osteophyte complex.
IMPRESSION: No acute intra-abdominal or pelvic process.

## 2018-12-04 ENCOUNTER — Encounter: Payer: Self-pay | Admitting: Family Medicine

## 2018-12-05 NOTE — Telephone Encounter (Signed)
Nurses I recommend the patient do a follow-up virtual or in person visit first week of September then we can order her echo for mitral valve prolapse

## 2018-12-17 ENCOUNTER — Encounter: Payer: Self-pay | Admitting: Family Medicine

## 2018-12-17 MED ORDER — SERTRALINE HCL 100 MG PO TABS: ORAL_TABLET | ORAL | 1 refills | Status: DC

## 2018-12-17 NOTE — Addendum Note (Signed)
Addended by: Vicente Males on: 12/17/2018 04:36 PM   Modules accepted: Orders

## 2018-12-17 NOTE — Telephone Encounter (Signed)
Nurses Please let the patient know that I would recommend 100 mg daily ongoing She can have 90 with 1 refill I would like for the patient to give [place] a MyChart update in approximately 2 to 3 weeks how that is going-if things are not improving I would highly recommend a virtual visit We can easily do a virtual visit to discuss further if any ongoing issues We will also follow-up with the patient's next ADD visit is thank you

## 2019-01-02 ENCOUNTER — Other Ambulatory Visit: Payer: Self-pay | Admitting: Family Medicine

## 2019-01-06 ENCOUNTER — Other Ambulatory Visit: Payer: Self-pay

## 2019-01-07 ENCOUNTER — Encounter: Payer: Self-pay | Admitting: Family Medicine

## 2019-01-07 ENCOUNTER — Ambulatory Visit (INDEPENDENT_AMBULATORY_CARE_PROVIDER_SITE_OTHER): Payer: BC Managed Care – PPO | Admitting: Family Medicine

## 2019-01-07 DIAGNOSIS — I341 Nonrheumatic mitral (valve) prolapse: Secondary | ICD-10-CM | POA: Insufficient documentation

## 2019-01-07 DIAGNOSIS — R079 Chest pain, unspecified: Secondary | ICD-10-CM

## 2019-01-07 NOTE — Progress Notes (Signed)
   Subjective:    Patient ID: Adrienne Church, female    DOB: Jul 01, 1985, 33 y.o.   MRN: [id number]  HPI Virtual visit Very nice patient Patient calls to discuss MVP. Patient states it has been 10 years since her last echo and check up with cardiology and needs to get scheduled.  Patient has been having some chest pain is been present over the past several months intermittently it is an occasional sharp pain she states she has mitral valve prolapse she denies any tachycardia palpitations she denies shortness of breath or heaviness or tightness but she does state that her dad had to have mitral valve replacement several years ago and she wants to make sure that she stays on top of things she saw cardiologist at [facility] cardiology 10 years ago and at that time they did do a echo and told her to do an echo every 10 years  She describes an occasional sharp pain in her chest lasts a second or 2 then goes away no dizziness with it no nausea vomiting or passing out no substernal pressure or tightness does not break out in sweat Virtual Visit via Video Note  I connected with Adrienne Church on 01/07/19 at  1:10 PM EDT by a video enabled telemedicine application and verified that I am speaking with the correct person using two identifiers.  Location: Patient: home Provider: office   I discussed the limitations of evaluation and management by telemedicine and the availability of in person appointments. The patient expressed understanding and agreed to proceed.  History of Present Illness:    Observations/Objective:   Assessment and Plan:   Follow Up Instructions:    I discussed the assessment and treatment plan with the patient. The patient was provided an opportunity to ask questions and all were answered. The patient agreed with the plan and demonstrated an understanding of the instructions.   The patient was advised to call back or seek an in-person evaluation if the symptoms worsen  or if the condition fails to improve as anticipated.  I provided 15 minutes of non-face-to-face time during this encounter.      Review of Systems  Constitutional: Negative for activity change and appetite change.  HENT: Negative for congestion and rhinorrhea.   Respiratory: Negative for cough and shortness of breath.   Cardiovascular: Positive for chest pain. Negative for palpitations and leg swelling.  Gastrointestinal: Negative for abdominal pain, nausea and vomiting.  Skin: Negative for color change.  Neurological: Negative for dizziness and weakness.  Psychiatric/Behavioral: Negative for agitation and confusion.       Objective:   Physical Exam  Patient had virtual visit Appears to be in no distress Atraumatic Neuro able to relate and oriented No apparent resp distress Color normal       Assessment & Plan:  Mitral valve prolapse Nonspecific chest pain unlikely to be sign of any type of coronary artery disease I would recommend an echo to look at mitral valve prolapse make sure she is not developing progressive symptoms or problems If echo looks reassuring then we will just follow periodically if echo shows serious issue get cardiology involved with consultation  Patient does states she will be seen ADD specialist coming up later this month she will get a copy of the report sent to [place]

## 2019-01-21 ENCOUNTER — Ambulatory Visit (HOSPITAL_COMMUNITY)
Admission: RE | Admit: 2019-01-21 | Discharge: 2019-01-21 | Disposition: A | Discharge status: Home / Self Care | Payer: BC Managed Care – PPO | Source: Ambulatory Visit | Attending: Family Medicine | Admitting: Family Medicine

## 2019-01-21 ENCOUNTER — Other Ambulatory Visit: Payer: Self-pay

## 2019-01-21 DIAGNOSIS — I341 Nonrheumatic mitral (valve) prolapse: Secondary | ICD-10-CM

## 2019-01-21 NOTE — Progress Notes (Signed)
*  PRELIMINARY RESULTS* Echocardiogram 2D Echocardiogram has been performed.  Adrienne Church 01/21/2019, 3:35 PM

## 2019-01-29 ENCOUNTER — Other Ambulatory Visit: Payer: Self-pay | Admitting: Family Medicine

## 2019-01-29 DIAGNOSIS — Z79899 Other long term (current) drug therapy: Secondary | ICD-10-CM | POA: Diagnosis not present

## 2019-01-29 DIAGNOSIS — F902 Attention-deficit hyperactivity disorder, combined type: Secondary | ICD-10-CM | POA: Diagnosis not present

## 2019-01-29 DIAGNOSIS — F419 Anxiety disorder, unspecified: Secondary | ICD-10-CM | POA: Diagnosis not present

## 2019-01-29 DIAGNOSIS — R41840 Attention and concentration deficit: Secondary | ICD-10-CM | POA: Diagnosis not present

## 2019-02-11 ENCOUNTER — Ambulatory Visit (INDEPENDENT_AMBULATORY_CARE_PROVIDER_SITE_OTHER): Payer: BC Managed Care – PPO | Admitting: Family Medicine

## 2019-02-11 DIAGNOSIS — S9030XS Contusion of unspecified foot, sequela: Secondary | ICD-10-CM | POA: Diagnosis not present

## 2019-02-11 MED ORDER — ONDANSETRON 4 MG PO TBDP: ORAL_TABLET | ORAL | 0 refills | Status: DC

## 2019-02-11 NOTE — Progress Notes (Signed)
   Subjective:  Audio plus video  Patient ID: Adrienne Church, female    DOB: July 04, 1985, 33 y.o.   MRN: [id number]  Foot Pain This is a new problem. The current episode started today. Associated symptoms include nausea. 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 She now has 2 knots on top of foot and is swollen.. She has tried ice (elevated) for the symptoms. The treatment provided mild relief.   Virtual Visit via Video Note  I connected with Adrienne Church on 02/11/19 at  2:00 PM EDT by a video enabled telemedicine application and verified that I am speaking with the correct person using two identifiers.  Location: Patient: home Provider: office   I discussed the limitations of evaluation and management by telemedicine and the availability of in person appointments. The patient expressed understanding and agreed to proceed.  History of Present Illness:    Observations/Objective:   Assessment and Plan:   Follow Up Instructions:    I discussed the assessment and treatment plan with the patient. The patient was provided an opportunity to ask questions and all were answered. The patient agreed with the plan and demonstrated an understanding of the instructions.   The patient was advised to call back or seek an in-person evaluation if the symptoms worsen or if the condition fails to improve as anticipated.  I provided 15 minutes of non-face-to-face time during this encounter.   Adrienne Males, LPN  Patient at work.  Pointing to dorsum of foot.  Had a box stop on it.  Initially had intense pain.  In fact even felt nausea with it.  Still having some nausea though the intense pain is improved.  She is able to walk around on it better now at this point.  Wondering if she needed x-rays  Review of Systems  Gastrointestinal: Positive for nausea.  No vomiting     Objective:   Physical Exam  Virtual       Assessment & Plan:  Impression contusion.  Pain improving in the interim since initial phone call.  Recommend symptom care.  Local measures discussed.  Zofran as needed for nausea if pain continues to persist in coming days call back and we can set up a x-ray

## 2019-02-16 ENCOUNTER — Encounter: Payer: Self-pay | Admitting: Family Medicine

## 2019-03-26 DIAGNOSIS — F419 Anxiety disorder, unspecified: Secondary | ICD-10-CM | POA: Diagnosis not present

## 2019-03-26 DIAGNOSIS — Z79899 Other long term (current) drug therapy: Secondary | ICD-10-CM | POA: Diagnosis not present

## 2019-03-26 DIAGNOSIS — F902 Attention-deficit hyperactivity disorder, combined type: Secondary | ICD-10-CM | POA: Diagnosis not present

## 2019-05-05 ENCOUNTER — Other Ambulatory Visit: Payer: Self-pay | Admitting: Family Medicine

## 2019-05-27 ENCOUNTER — Other Ambulatory Visit: Payer: Self-pay | Admitting: Family Medicine

## 2019-05-27 NOTE — Telephone Encounter (Signed)
May do this with 2 refills may pend with a follow-up visit by spring virtual

## 2019-06-18 DIAGNOSIS — Z79899 Other long term (current) drug therapy: Secondary | ICD-10-CM | POA: Diagnosis not present

## 2019-06-18 DIAGNOSIS — F902 Attention-deficit hyperactivity disorder, combined type: Secondary | ICD-10-CM | POA: Diagnosis not present

## 2019-06-18 DIAGNOSIS — F419 Anxiety disorder, unspecified: Secondary | ICD-10-CM | POA: Diagnosis not present

## 2019-08-06 ENCOUNTER — Other Ambulatory Visit: Payer: Self-pay

## 2019-08-06 ENCOUNTER — Encounter: Payer: Self-pay | Admitting: Orthopedic Surgery

## 2019-08-06 ENCOUNTER — Ambulatory Visit: Payer: BC Managed Care – PPO | Admitting: Orthopedic Surgery

## 2019-08-06 ENCOUNTER — Ambulatory Visit: Payer: BC Managed Care – PPO

## 2019-08-06 VITALS — BP 141/86 | HR 87 | Ht 65.0 in | Wt 168.0 lb

## 2019-08-06 DIAGNOSIS — M542 Cervicalgia: Secondary | ICD-10-CM | POA: Diagnosis not present

## 2019-08-06 DIAGNOSIS — M62838 Other muscle spasm: Secondary | ICD-10-CM | POA: Diagnosis not present

## 2019-08-06 MED ORDER — PREDNISONE 10 MG (48) PO TBPK: ORAL_TABLET | Freq: Every day | ORAL | 0 refills | Status: DC

## 2019-08-06 MED ORDER — DIAZEPAM 5 MG PO TABS: 5.0000 mg | ORAL_TABLET | Freq: Four times a day (QID) | ORAL | 0 refills | Status: AC | PRN

## 2019-08-06 NOTE — Progress Notes (Signed)
Chief Complaint  Patient presents with  . Neck Pain    into right shoulder/ scapula since moving mini fridge on 08/02/19    34 year old female picked up a refrigerator and then the next day felt neck spasms and pain radiating from her mid cervical spine down the medial scapular into the right trapezoid muscle and since that time she can extend her neck.  She denies numbness or tingling in the right upper extremity no symptoms in the hand  Allergies  Allergen Reactions  . Azithromycin Other (See Comments)    other   Past Medical History:  Diagnosis Date  . Anxiety   . MVP (mitral valve prolapse)   . Renal disorder    kidney stones   Social History   Tobacco Use  . Smoking status: Never Smoker  . Smokeless tobacco: Never Used  Substance Use Topics  . Alcohol use: Yes    Comment: occ  . Drug use: No    BP (!) 141/86   Pulse 87   Ht 5\' 5"  (1.651 m)   Wt 168 lb (76.2 kg)   LMP 07/16/2019 (Approximate)   BMI 27.96 kg/m   Normal development grooming and hygiene oriented x3 pleasant mood normal affect no gait disturbances  Patient has tenderness in the mid cervical spine medial border of scapula and right trapezius muscle in the upper trap.  Normal neurovascular function in both hands distally.  Skin normal neck and both shoulders.  Good pulses as stated.  No lymphadenopathy.  Significant and severe loss of motion especially extension patient holds her head in flexion has trouble turning and laterally bending  Reflexes are normal.  X-ray shows loss of cervical lordosis with no significant changes in the disc spaces or facet joints  Impression Encounter Diagnoses  Name Primary?  . Neck pain Yes  . Cervical paraspinal muscle spasm     Meds ordered this encounter  Medications  . predniSONE (STERAPRED UNI-PAK 48 TAB) 10 MG (48) TBPK tablet    Sig: Take by mouth daily.    Dispense:  48 tablet    Refill:  0  . diazepam (VALIUM) 5 MG tablet    Sig: Take 1 tablet (5 mg  total) by mouth every 6 (six) hours as needed for up to 5 days for anxiety.    Dispense:  20 tablet    Refill:  0   Once we get control of the muscle spasms then we can change the Valium to a less potent muscle relaxer

## 2019-08-06 NOTE — Patient Instructions (Signed)
Take the valium at night

## 2019-08-26 ENCOUNTER — Other Ambulatory Visit: Payer: Self-pay

## 2019-08-26 ENCOUNTER — Ambulatory Visit: Payer: BC Managed Care – PPO | Admitting: Orthopedic Surgery

## 2019-08-26 ENCOUNTER — Encounter: Payer: Self-pay | Admitting: Orthopedic Surgery

## 2019-08-26 VITALS — BP 131/85 | HR 101 | Ht 65.0 in | Wt 168.0 lb

## 2019-08-26 DIAGNOSIS — M542 Cervicalgia: Secondary | ICD-10-CM | POA: Diagnosis not present

## 2019-08-26 DIAGNOSIS — M62838 Other muscle spasm: Secondary | ICD-10-CM

## 2019-08-26 MED ORDER — TIZANIDINE HCL 4 MG PO TABS: 4.0000 mg | ORAL_TABLET | Freq: Four times a day (QID) | ORAL | 2 refills | Status: DC | PRN

## 2019-08-26 NOTE — Progress Notes (Signed)
Chief Complaint  Patient presents with  . Neck Pain    Encounter Diagnoses  Name Primary?  . Cervical paraspinal muscle spasm Yes  . Neck pain    Adrienne Church says that her neck spasms have improved but have not completely gone away.  She reports improved range of motion after taking the Valium and the steroid pack  Review of systems she denies any numbness or tingling in the right or left upper extremity  BP 131/85   Pulse (!) 101   Ht 5\' 5"  (1.651 m)   Wt 168 lb (76.2 kg)   BMI 27.96 kg/m   She has improved rotation to the right normal to the left still some pain with extension  No weakness in her upper extremities  Recommend transitioning to tizanidine continuing heat and follow-up if she worsens  Encounter Diagnoses  Name Primary?  . Cervical paraspinal muscle spasm Yes  . Neck pain      Meds ordered this encounter  Medications  . tiZANidine (ZANAFLEX) 4 MG tablet    Sig: Take 1 tablet (4 mg total) by mouth every 6 (six) hours as needed for muscle spasms.    Dispense:  28 tablet    Refill:  2

## 2019-08-26 NOTE — Patient Instructions (Signed)
Take the new medication as needed  Apply heat pad as needed

## 2019-10-23 ENCOUNTER — Other Ambulatory Visit: Payer: Self-pay | Admitting: Orthopedic Surgery

## 2019-10-23 DIAGNOSIS — M62838 Other muscle spasm: Secondary | ICD-10-CM

## 2019-10-23 DIAGNOSIS — M542 Cervicalgia: Secondary | ICD-10-CM

## 2019-10-24 ENCOUNTER — Other Ambulatory Visit: Payer: Self-pay | Admitting: Family Medicine

## 2019-10-24 NOTE — Telephone Encounter (Signed)
Last check up for this med was July 2020

## 2019-11-10 ENCOUNTER — Encounter: Payer: Self-pay | Admitting: Family Medicine

## 2019-11-27 ENCOUNTER — Ambulatory Visit: Payer: BC Managed Care – PPO | Admitting: Orthopaedic Surgery

## 2019-11-27 ENCOUNTER — Other Ambulatory Visit: Payer: Self-pay

## 2019-11-27 ENCOUNTER — Encounter: Payer: Self-pay | Admitting: Orthopaedic Surgery

## 2019-11-27 ENCOUNTER — Ambulatory Visit: Payer: BC Managed Care – PPO

## 2019-11-27 VITALS — BP 121/83 | HR 92 | Ht 65.0 in | Wt 168.0 lb

## 2019-11-27 DIAGNOSIS — M25571 Pain in right ankle and joints of right foot: Secondary | ICD-10-CM

## 2019-11-27 NOTE — Progress Notes (Signed)
[name], female DOB:July 10, 1985, 34 y.o. UPJ:[id number]  Chief Complaint  Patient presents with  . Ankle Pain    right     HPI   Adrienne Church is a 34 y.o. female who started having pain of the right lateral ankle while walking [place] the beach last [place].  She has swelling and pain of the right lateral ankle only in certain motions.  She has no redness, no numbness.  She walks in today wearing flip flops and no limp.  She has not taken anything.    Body mass index is 27.96 kg/m.  ROS  Review of Systems  Constitutional: Positive for activity change.  Musculoskeletal: Positive for arthralgias, gait problem and joint swelling.  All other systems reviewed and are negative.   All other systems reviewed and are negative.  The following is a summary of the past history medically, past history surgically, known current medicines, social history and family history.  This information is gathered electronically by the computer from prior information and documentation.  I review this each visit and have found including this information at this point in the chart is beneficial and informative.    Past Medical History:  Diagnosis Date  . Anxiety   . MVP (mitral valve prolapse)   . Renal disorder    kidney stones    Past Surgical History:  Procedure Laterality Date  . BREAST REDUCTION SURGERY    . TENDON REPAIR      Family History  Problem Relation Age of Onset  . Kidney disease Mother   . Mental illness Mother   . Thyroid disease Mother   . Mitral valve prolapse Father     Social History Social History   Tobacco Use  . Smoking status: Never Smoker  . Smokeless tobacco: Never Used  Substance Use Topics  . Alcohol use: Yes    Comment: occ  . Drug use: No    Allergies  Allergen Reactions  . Azithromycin Other (See Comments)    other    Current Outpatient Medications  Medication Sig Dispense Refill  . ALPRAZolam (XANAX) 0.5 MG tablet TAKE 1/2 TO 1  TABLET BY MOUTH DAILY AS NEEDED 15 tablet 0  . Amphet-Dextroamphet 3-Bead ER (MYDAYIS) 50 MG CP24 Take by mouth.    . fluconazole (DIFLUCAN) 150 MG tablet TAKE 1 TABLET BY MOUTH NOW THEN REPEAT IN 3 DAYS AS NEEDED 2 tablet 0  . fluocinonide cream (LIDEX) 0.05 % APPLY TO AFFECTED AREAS AS NEEDED 60 g 3  . hydrOXYzine (ATARAX/VISTARIL) 25 MG tablet TAKE 1 TABLET BY MOUTH THREE TIMES DAILY AS NEEDED 90 tablet 1  . labetalol (NORMODYNE) 100 MG tablet Take 100 mg by mouth 2 (two) times daily.    Marland Kitchen letrozole (FEMARA) 2.5 MG tablet Take 2.5 mg by mouth daily.    Marland Kitchen lisdexamfetamine (VYVANSE) 40 MG capsule Take 40 mg by mouth every morning.    . metFORMIN (GLUCOPHAGE-XR) 500 MG 24 hr tablet Take 500 mg by mouth daily with breakfast.    . Norgestimate-Ethinyl Estradiol Triphasic 0.18/0.215/0.25 MG-25 MCG tab Take 1 tablet by mouth daily. 1 Package 5  . ondansetron (ZOFRAN ODT) 4 MG disintegrating tablet Take one tablet po every 6 hours prn nausea 16 tablet 0  . Prenatal Multivit-Min-Fe-FA (PRE-NATAL PO) Take 1 tablet by mouth daily.    . promethazine (PHENERGAN) 25 MG tablet Take 1 tablet (25 mg total) by mouth every 6 (six) hours as needed for nausea or vomiting. 30 tablet  0  . sertraline (ZOLOFT) 100 MG tablet Take one tablet by mouth daily 90 tablet 1  . silver sulfADIAZINE (SILVADENE) 1 % cream Apply daily until healed 50 g 0  . valACYclovir (VALTREX) 1000 MG tablet 2 pills now and 2 pills 12 hours from now     No current facility-administered medications for this visit.     Physical Exam  Blood pressure 121/83, pulse 92, height 5\' 5"  (1.651 m), weight 168 lb (76.2 kg), last menstrual period 11/24/2019.  Constitutional: overall normal hygiene, normal nutrition, well developed, normal grooming, normal body habitus. Assistive device:none  Musculoskeletal: gait and station Limp none, muscle tone and strength are normal, no tremors or atrophy is present.  .  Neurological: coordination overall  normal.  Deep tendon reflex/nerve stretch intact.  Sensation normal.  Cranial nerves II-XII intact.   Skin:   Normal overall no scars, lesions, ulcers or rashes. No psoriasis.  Psychiatric: Alert and oriented x 3.  Recent memory intact, remote memory unclear.  Normal mood and affect. Well groomed.  Good eye contact.  Cardiovascular: overall no swelling, no varicosities, no edema bilaterally, normal temperatures of the legs and arms, no clubbing, cyanosis and good capillary refill.  She has some slight tenderness of the right anterior talofibular ligament.  She has full ROM of the ankle and no limp.  NV intact.  Lymphatic: palpation is normal.  All other systems reviewed and are negative   The patient has been educated about the nature of the problem(s) and counseled on treatment options.  The patient appeared to understand what I have discussed and is in agreement with it.  Encounter Diagnosis  Name Primary?  . Acute right ankle pain Yes   X-rays were done of the right ankle, reported separately.  PLAN Call if any problems.  Precautions discussed.  Continue current medications.   Return to clinic prn  It should slowly get better.  Call if worse.   Electronically Signed 11/26/2019, MD 7/22/202112:08 PM

## 2019-11-27 NOTE — Patient Instructions (Signed)
Use Aspercreme, Biofreeze or Voltaren gel over the counter 2-3 times daily make sure you rub it in well each time you use it.   

## 2019-12-08 ENCOUNTER — Encounter: Payer: Self-pay | Admitting: Family Medicine

## 2019-12-08 NOTE — Progress Notes (Signed)
   Subjective:    Patient ID: Adrienne Church, female    DOB: 1986-01-09, 34 y.o.   MRN: [id number]    Review of Systems     Objective:   Physical Exam        Assessment & Plan:   This encounter was created in error - please disregard.

## 2019-12-11 ENCOUNTER — Other Ambulatory Visit: Payer: Self-pay | Admitting: Orthopedic Surgery

## 2019-12-11 MED ORDER — METHOCARBAMOL 500 MG PO TABS: 500.0000 mg | ORAL_TABLET | Freq: Three times a day (TID) | ORAL | 1 refills | Status: DC

## 2019-12-11 NOTE — Telephone Encounter (Signed)
Sabryn called back and left a message asking for muscle relaxer

## 2019-12-18 ENCOUNTER — Other Ambulatory Visit: Payer: Self-pay | Admitting: Family Medicine

## 2019-12-19 ENCOUNTER — Encounter: Payer: Self-pay | Admitting: Family Medicine

## 2019-12-22 ENCOUNTER — Other Ambulatory Visit: Payer: Self-pay | Admitting: Family Medicine

## 2019-12-22 NOTE — Telephone Encounter (Signed)
Scheduled 9/1.

## 2019-12-22 NOTE — Telephone Encounter (Signed)
Please contact pt to schedule appt and then may send back to nurses. Thank you!

## 2020-01-07 ENCOUNTER — Ambulatory Visit: Payer: BC Managed Care – PPO | Admitting: Family Medicine

## 2020-01-07 ENCOUNTER — Other Ambulatory Visit: Payer: Self-pay

## 2020-01-07 ENCOUNTER — Encounter: Payer: Self-pay | Admitting: Family Medicine

## 2020-01-07 VITALS — BP 122/82 | Temp 97.3°F | Wt 173.8 lb

## 2020-01-07 DIAGNOSIS — F411 Generalized anxiety disorder: Secondary | ICD-10-CM

## 2020-01-07 MED ORDER — SERTRALINE HCL 50 MG PO TABS: ORAL_TABLET | ORAL | 1 refills | Status: DC

## 2020-01-07 NOTE — Progress Notes (Signed)
   Subjective:    Patient ID: Adrienne Church, female    DOB: 15-May-1985, 34 y.o.   MRN: [id number]  HPI Patient arrives for a follow up on Anxiety. Patient states she is doing well with no concerns. Moderate anxiety under good control with medication denies any major setbacks or problems patient is also followed by her specialist for ADD and other issues patient denies being depressed Review of Systems See above    Objective:   Physical Exam Lungs clear heart regular extremities no edema skin warm dry blood pressure good       Assessment & Plan:  Generalized anxiety disorder Xanax sparingly Continue sertraline patient currently using 50 mg every other day go ahead with this Follow-up if ongoing troubles

## 2020-01-23 ENCOUNTER — Other Ambulatory Visit: Payer: Self-pay | Admitting: Family Medicine

## 2020-06-06 ENCOUNTER — Encounter: Payer: Self-pay | Admitting: Family Medicine

## 2020-06-07 ENCOUNTER — Other Ambulatory Visit: Payer: Self-pay | Admitting: Family Medicine

## 2020-06-07 MED ORDER — ALPRAZOLAM 0.5 MG PO TABS: ORAL_TABLET | ORAL | 2 refills | Status: DC

## 2020-06-07 MED ORDER — SERTRALINE HCL 50 MG PO TABS: ORAL_TABLET | ORAL | 1 refills | Status: DC

## 2020-07-14 ENCOUNTER — Other Ambulatory Visit: Payer: Self-pay | Admitting: Family Medicine

## 2020-07-14 MED ORDER — FLUCONAZOLE 150 MG PO TABS: ORAL_TABLET | ORAL | 1 refills | Status: DC

## 2020-07-14 NOTE — Telephone Encounter (Signed)
Sent My Chart message to patient to inquire about symptoms.

## 2020-09-07 ENCOUNTER — Other Ambulatory Visit: Payer: Self-pay | Admitting: Family Medicine

## 2020-11-04 ENCOUNTER — Other Ambulatory Visit: Payer: Self-pay | Admitting: Family Medicine

## 2020-11-04 ENCOUNTER — Encounter: Payer: Self-pay | Admitting: Family Medicine

## 2020-11-04 MED ORDER — ALPRAZOLAM 0.5 MG PO TABS: ORAL_TABLET | ORAL | 1 refills | Status: DC

## 2020-11-04 NOTE — Telephone Encounter (Signed)
Nurses I am fine with doing her Xanax prescription with 1 additional refill patient needs to do a follow-up office visit in August please verify pharmacy then send it through prescription refills to me to sign off on thank you

## 2020-11-26 ENCOUNTER — Encounter: Payer: Self-pay | Admitting: Family Medicine

## 2020-11-26 MED ORDER — ALBUTEROL SULFATE HFA 108 (90 BASE) MCG/ACT IN AERS
2.0000 | INHALATION_SPRAY | Freq: Four times a day (QID) | RESPIRATORY_TRACT | 0 refills | Status: DC | PRN
Start: 2020-11-26 — End: 2022-03-13

## 2020-11-26 NOTE — Telephone Encounter (Signed)
  Nurse pls call in an extra ventolin inhaler.   Thx.   Dr. Ladona Ridgel    Covid 19 recommendations-  It's symptomatic tx for viral illness. Take mucinex as directed, sudafed or claritin-D.   Increase fluid intake.  Vit C, vit D and zinc are recommended vitamins to take.   zinc dose of 75 mg-100mg .  daily doses of vitamin D3 (1,000-4,000 IU), vitamin C (500 mg), and melatonin (0.3mg -2 mg each night).   Flonase for nasal congestion or saline nasal rinses.   If not getting better with all this or worsening, then need to go to urgent  for further evaluation and check of vitals.   Thx.   Dr. Ladona Ridgel

## 2020-12-28 ENCOUNTER — Encounter: Payer: Self-pay | Admitting: Family Medicine

## 2020-12-28 ENCOUNTER — Other Ambulatory Visit: Payer: Self-pay

## 2020-12-28 DIAGNOSIS — F411 Generalized anxiety disorder: Secondary | ICD-10-CM

## 2020-12-28 MED ORDER — LAMOTRIGINE 100 MG PO TABS: ORAL_TABLET | ORAL | 0 refills | Status: DC

## 2020-12-28 NOTE — Addendum Note (Signed)
Addended by: Marlowe Shores on: 12/28/2020 11:58 AM   Modules accepted: Orders

## 2020-12-28 NOTE — Telephone Encounter (Signed)
Nurses  May give refill of medication Lamictal 100 mg 1 daily, please verify dose with pharmacy before giving refill for 30 days  Please also initiate referral forward behavioral health/psychiatry within network to help her with long-term management of this medication.  Long-term usage of this medication is outside the area of my expertise.  Please assist [facility] with this process It can take several weeks to get in with them so the patient should set up a follow-up visit with me somewhere in September so I can extend the refills if necessary to tide her over till when she sees a specialist for this issue  Thanks-Dr. Lorin Picket

## 2021-01-07 NOTE — Telephone Encounter (Signed)
Nurses and Toni Amend  Please interact with Toni Amend as well as the patient  It would be helpful for the patient to know what you sent me regarding the psychology policies  Also unfortunately our office cannot necessarily control the billing practices of these other practices  Perhaps there are other psychology groups that are on her insurance that could be consulted with  Or perhaps the patient could be given a short list of other options that she could call and find out the financial arrangements with those practices  Thanks for helping her

## 2021-01-11 ENCOUNTER — Other Ambulatory Visit (HOSPITAL_COMMUNITY): Payer: Self-pay

## 2021-01-11 MED ORDER — VYVANSE 60 MG PO CHEW
1.0000 | CHEWABLE_TABLET | Freq: Every day | ORAL | 0 refills | Status: DC
  Filled 2021-01-11: qty 30, 30d supply, fill #0

## 2021-01-17 ENCOUNTER — Other Ambulatory Visit (HOSPITAL_COMMUNITY): Payer: Self-pay

## 2021-01-17 MED ORDER — VYVANSE 60 MG PO CHEW: 60.0000 mg | CHEWABLE_TABLET | Freq: Every day | ORAL | 0 refills | Status: DC

## 2021-01-17 MED ORDER — VYVANSE 60 MG PO CHEW
60.0000 mg | CHEWABLE_TABLET | Freq: Every day | ORAL | 0 refills | Status: DC
  Filled 2021-02-07: qty 30, 30d supply, fill #0

## 2021-01-18 ENCOUNTER — Encounter: Payer: Self-pay | Admitting: Family Medicine

## 2021-01-18 ENCOUNTER — Other Ambulatory Visit (HOSPITAL_COMMUNITY): Payer: Self-pay

## 2021-01-19 ENCOUNTER — Ambulatory Visit (INDEPENDENT_AMBULATORY_CARE_PROVIDER_SITE_OTHER): Payer: Self-pay | Admitting: Family Medicine

## 2021-01-19 ENCOUNTER — Other Ambulatory Visit: Payer: Self-pay

## 2021-01-19 ENCOUNTER — Other Ambulatory Visit (HOSPITAL_COMMUNITY): Payer: Self-pay

## 2021-01-19 ENCOUNTER — Other Ambulatory Visit: Payer: Self-pay | Admitting: Family Medicine

## 2021-01-19 DIAGNOSIS — L83 Acanthosis nigricans: Secondary | ICD-10-CM

## 2021-01-19 DIAGNOSIS — F411 Generalized anxiety disorder: Secondary | ICD-10-CM

## 2021-01-19 DIAGNOSIS — Z79899 Other long term (current) drug therapy: Secondary | ICD-10-CM

## 2021-01-19 MED ORDER — KETOCONAZOLE 2 % EX CREA
1.0000 "application " | TOPICAL_CREAM | Freq: Two times a day (BID) | CUTANEOUS | 0 refills | Status: DC | PRN
  Filled 2021-01-19: qty 30, 15d supply, fill #0

## 2021-01-19 MED ORDER — ALPRAZOLAM 0.5 MG PO TABS
ORAL_TABLET | ORAL | 1 refills | Status: DC
  Filled 2021-01-19: qty 30, 15d supply, fill #0
  Filled 2021-02-07: qty 30, 15d supply, fill #1

## 2021-01-19 MED ORDER — VYVANSE 60 MG PO CHEW
1.0000 | CHEWABLE_TABLET | Freq: Every day | ORAL | 0 refills | Status: DC
  Filled 2021-01-19 – 2021-05-09 (×4): qty 30, 30d supply, fill #0

## 2021-01-19 MED ORDER — LAMOTRIGINE 100 MG PO TABS
ORAL_TABLET | ORAL | 0 refills | Status: DC
  Filled 2021-01-19: qty 90, 90d supply, fill #0

## 2021-01-19 MED ORDER — VYVANSE 60 MG PO CHEW
60.0000 mg | CHEWABLE_TABLET | Freq: Every day | ORAL | 0 refills | Status: DC
  Filled 2021-04-07: qty 30, 30d supply, fill #0

## 2021-01-19 MED ORDER — SERTRALINE HCL 50 MG PO TABS
50.0000 mg | ORAL_TABLET | Freq: Every day | ORAL | 1 refills | Status: DC
  Filled 2021-01-19: qty 90, 90d supply, fill #0

## 2021-01-19 MED ORDER — VYVANSE 60 MG PO CHEW
60.0000 mg | CHEWABLE_TABLET | Freq: Every day | ORAL | 0 refills | Status: DC
  Filled 2021-03-08: qty 30, 30d supply, fill #0

## 2021-01-19 NOTE — Addendum Note (Signed)
Addended by: Alm Bustard R on: 01/19/2021 12:08 PM   Modules accepted: Orders

## 2021-01-19 NOTE — Progress Notes (Addendum)
   Subjective:    Patient ID: Adrienne Church, female    DOB: 1985/06/09, 35 y.o.   MRN: [id number] Virtual Visit via Video Note  I connected with Adrienne Church on 02/03/21 at  8:20 AM EDT by a video enabled telemedicine application and verified that I am speaking with the correct person using two identifiers.  Location: Patient: Workplace Provider: Office   I discussed the limitations of evaluation and management by telemedicine and the availability of in person appointments. The patient expressed understanding and agreed to proceed.  History of Present Illness:    Observations/Objective:   Assessment and Plan:   Follow Up Instructions:    I discussed the assessment and treatment plan with the patient. The patient was provided an opportunity to ask questions and all were answered. The patient agreed with the plan and demonstrated an understanding of the instructions.   The patient was advised to call back or seek an in-person evaluation if the symptoms worsen or if the condition fails to improve as anticipated.  I provided 15 minutes of non-face-to-face time during this encounter.   Adrienne Punt, MD  HPI This patient has adult ADD. Takes medication responsibly. Medication does help the patient focus in be more functional. Patient relates that they are or not abusing the medication or misusing the medication. The patient understands that if they're having any negative side effects such as elevated high blood pressure severe headaches they would need stop the medication follow-up immediately. They also understand that the prescriptions are to last for 3 months then the patient will need to follow-up before having further prescriptions.  Patient compliance she relates compliance with the medicine most days she takes a full dose some days at half dose  Does medication help patient function /attention better she states it does allow her to function better at work without it she did  have a difficult time  Side effects she denies side effects  She did see a psychiatrist recently that recommended Lamictal to help her with mood stabilizer she states it does seem to be helping she would like to continue this I have encouraged her to set herself up with a new psychiatrist she is working on this she will let us know    Review of Systems     Objective:   Physical Exam  Today's visit was via telephone Physical exam was not possible for this visit       Assessment & Plan:  1. GAD (generalized anxiety disorder) She does have some mild anxiety does not use Xanax on a regular basis - Lipid panel - Hepatic function panel - Basic metabolic panel - Hemoglobin A1c - CBC with Differential/Platelet  2. High risk medication use We will check lab work its been a while she also has some skin markings that could be indication of developing diabetes - Lipid panel - Hepatic function panel - Basic metabolic panel - Hemoglobin A1c - CBC with Differential/Platelet  3. Acanthosis nigricans Check A1c healthy eating regular activity recommended - Lipid panel - Hepatic function panel - Basic metabolic panel - Hemoglobin A1c - CBC with Differential/Platelet  Next visit in person

## 2021-01-20 ENCOUNTER — Other Ambulatory Visit (HOSPITAL_COMMUNITY): Payer: Self-pay

## 2021-02-05 ENCOUNTER — Other Ambulatory Visit: Payer: Self-pay | Admitting: Family Medicine

## 2021-02-07 ENCOUNTER — Other Ambulatory Visit (HOSPITAL_COMMUNITY): Payer: Self-pay

## 2021-03-08 ENCOUNTER — Other Ambulatory Visit: Payer: Self-pay | Admitting: Family Medicine

## 2021-03-08 ENCOUNTER — Other Ambulatory Visit (HOSPITAL_COMMUNITY): Payer: Self-pay

## 2021-03-08 MED ORDER — ALPRAZOLAM 0.5 MG PO TABS
0.2500 mg | ORAL_TABLET | Freq: Two times a day (BID) | ORAL | 4 refills | Status: DC | PRN
  Filled 2021-03-08: qty 30, 15d supply, fill #0
  Filled 2021-03-29: qty 30, 15d supply, fill #1
  Filled 2021-05-04: qty 30, 15d supply, fill #2
  Filled 2021-05-25: qty 30, 15d supply, fill #3
  Filled 2021-07-07: qty 30, 15d supply, fill #4

## 2021-03-09 ENCOUNTER — Other Ambulatory Visit (HOSPITAL_COMMUNITY): Payer: Self-pay

## 2021-03-19 LAB — BASIC METABOLIC PANEL
BUN/Creatinine Ratio: 12 (ref 9–23)
BUN: 10 mg/dL (ref 6–20)
CO2: 23 mmol/L (ref 20–29)
Calcium: 9 mg/dL (ref 8.7–10.2)
Chloride: 101 mmol/L (ref 96–106)
Creatinine, Ser: 0.86 mg/dL (ref 0.57–1.00)
Glucose: 99 mg/dL (ref 70–99)
Potassium: 4.4 mmol/L (ref 3.5–5.2)
Sodium: 137 mmol/L (ref 134–144)
eGFR: 90 mL/min/{1.73_m2} (ref >59)

## 2021-03-19 LAB — CBC WITH DIFFERENTIAL/PLATELET
Basophils Absolute: 0.1 10*3/uL (ref 0.0–0.2)
Basos: 1 %
EOS (ABSOLUTE): 0.1 10*3/uL (ref 0.0–0.4)
Eos: 1 %
Hematocrit: 39.7 % (ref 34.0–46.6)
Hemoglobin: 13.2 g/dL (ref 11.1–15.9)
Immature Grans (Abs): 0 10*3/uL (ref 0.0–0.1)
Immature Granulocytes: 0 %
Lymphocytes Absolute: 3.6 10*3/uL — ABNORMAL HIGH (ref 0.7–3.1)
Lymphs: 40 %
MCH: 29.5 pg (ref 26.6–33.0)
MCHC: 33.2 g/dL (ref 31.5–35.7)
MCV: 89 fL (ref 79–97)
Monocytes Absolute: 0.6 10*3/uL (ref 0.1–0.9)
Monocytes: 6 %
Neutrophils Absolute: 4.7 10*3/uL (ref 1.4–7.0)
Neutrophils: 52 %
Platelets: 309 10*3/uL (ref 150–450)
RBC: 4.47 x10E6/uL (ref 3.77–5.28)
RDW: 12.8 % (ref 11.7–15.4)
WBC: 9.1 10*3/uL (ref 3.4–10.8)

## 2021-03-19 LAB — LIPID PANEL
Chol/HDL Ratio: 3.4 ratio (ref 0.0–4.4)
Cholesterol, Total: 199 mg/dL (ref 100–199)
HDL: 59 mg/dL (ref >39)
LDL Chol Calc (NIH): 113 mg/dL — ABNORMAL HIGH (ref 0–99)
Triglycerides: 154 mg/dL — ABNORMAL HIGH (ref 0–149)
VLDL Cholesterol Cal: 27 mg/dL (ref 5–40)

## 2021-03-19 LAB — HEMOGLOBIN A1C
Est. average glucose Bld gHb Est-mCnc: 114 mg/dL
Hgb A1c MFr Bld: 5.6 % (ref 4.8–5.6)

## 2021-03-19 LAB — HEPATIC FUNCTION PANEL
ALT: 14 IU/L (ref 0–32)
AST: 15 IU/L (ref 0–40)
Albumin: 4.7 g/dL (ref 3.8–4.8)
Alkaline Phosphatase: 70 IU/L (ref 44–121)
Bilirubin Total: 0.5 mg/dL (ref 0.0–1.2)
Bilirubin, Direct: 0.13 mg/dL (ref 0.00–0.40)
Total Protein: 7.3 g/dL (ref 6.0–8.5)

## 2021-03-28 ENCOUNTER — Encounter: Payer: Self-pay | Admitting: Family Medicine

## 2021-03-28 MED ORDER — VALACYCLOVIR HCL 1 G PO TABS
2000.0000 mg | ORAL_TABLET | Freq: Two times a day (BID) | ORAL | 5 refills | Status: DC
  Filled 2021-03-29: qty 4, 1d supply, fill #0
  Filled 2021-10-04: qty 4, 1d supply, fill #1

## 2021-03-28 NOTE — Telephone Encounter (Signed)
I reviewed over the CBC I would not be worried about the lymphocyte aspect at this point.  Given that all the other indices look normal.  May have 6 refills on Valtrex  Follow-up by February thanks-Dr. Lorin Picket

## 2021-03-29 ENCOUNTER — Other Ambulatory Visit (HOSPITAL_COMMUNITY): Payer: Self-pay

## 2021-03-29 MED ORDER — METFORMIN HCL 1000 MG PO TABS
ORAL_TABLET | ORAL | 11 refills | Status: DC
  Filled 2021-03-29: qty 60, 30d supply, fill #0
  Filled 2021-05-25: qty 60, 30d supply, fill #1
  Filled 2021-07-07: qty 60, 30d supply, fill #2
  Filled 2021-08-04: qty 180, 90d supply, fill #3

## 2021-04-07 ENCOUNTER — Other Ambulatory Visit (HOSPITAL_COMMUNITY): Payer: Self-pay

## 2021-04-20 ENCOUNTER — Encounter: Payer: Self-pay | Admitting: Family Medicine

## 2021-04-20 ENCOUNTER — Other Ambulatory Visit: Payer: Self-pay | Admitting: Family Medicine

## 2021-04-21 ENCOUNTER — Other Ambulatory Visit (HOSPITAL_COMMUNITY): Payer: Self-pay

## 2021-04-21 MED ORDER — LAMOTRIGINE 100 MG PO TABS
100.0000 mg | ORAL_TABLET | Freq: Every day | ORAL | 0 refills | Status: DC
  Filled 2021-04-21: qty 90, 90d supply, fill #0

## 2021-04-22 ENCOUNTER — Other Ambulatory Visit (HOSPITAL_COMMUNITY): Payer: Self-pay

## 2021-04-22 ENCOUNTER — Telehealth: Payer: No Typology Code available for payment source | Admitting: Physician Assistant

## 2021-04-22 DIAGNOSIS — J40 Bronchitis, not specified as acute or chronic: Secondary | ICD-10-CM | POA: Diagnosis not present

## 2021-04-22 MED ORDER — PSEUDOEPH-BROMPHEN-DM 30-2-10 MG/5ML PO SYRP
5.0000 mL | ORAL_SOLUTION | Freq: Four times a day (QID) | ORAL | 0 refills | Status: DC | PRN
  Filled 2021-04-22: qty 120, 6d supply, fill #0

## 2021-04-22 MED ORDER — PREDNISONE 10 MG (21) PO TBPK
ORAL_TABLET | ORAL | 0 refills | Status: DC
  Filled 2021-04-22: qty 21, 6d supply, fill #0

## 2021-04-22 NOTE — Progress Notes (Signed)
We are sorry that you are not feeling well.  Here is how we plan to help!  Based on your presentation I believe you most likely have A cough due to bacteria.  When patients have a fever and a productive cough with a change in color or increased sputum production, we are concerned about bacterial bronchitis.  If left untreated it can progress to pneumonia.  If your symptoms do not improve with your treatment plan it is important that you contact your provider.   Continue Amoxicillin as prescribed.   In addition you may use Bromfed DM cough syrup  Prednisone 10 mg daily for 6 days (see taper instructions below)  Directions for 6 day taper: Day 1: 2 tablets before breakfast, 1 after both lunch & dinner and 2 at bedtime Day 2: 1 tab before breakfast, 1 after both lunch & dinner and 2 at bedtime Day 3: 1 tab at each meal & 1 at bedtime Day 4: 1 tab at breakfast, 1 at lunch, 1 at bedtime Day 5: 1 tab at breakfast & 1 tab at bedtime Day 6: 1 tab at breakfast  From your responses in the eVisit questionnaire you describe inflammation in the upper respiratory tract which is causing a significant cough.  This is commonly called Bronchitis and has four common causes:   Allergies Viral Infections Acid Reflux Bacterial Infection Allergies, viruses and acid reflux are treated by controlling symptoms or eliminating the cause. An example might be a cough caused by taking certain blood pressure medications. You stop the cough by changing the medication. Another example might be a cough caused by acid reflux. Controlling the reflux helps control the cough.  USE OF BRONCHODILATOR ("RESCUE") INHALERS: There is a risk from using your bronchodilator too frequently.  The risk is that over-reliance on a medication which only relaxes the muscles surrounding the breathing tubes can reduce the effectiveness of medications prescribed to reduce swelling and congestion of the tubes themselves.  Although you feel brief  relief from the bronchodilator inhaler, your asthma may actually be worsening with the tubes becoming more swollen and filled with mucus.  This can delay other crucial treatments, such as oral steroid medications. If you need to use a bronchodilator inhaler daily, several times per day, you should discuss this with your provider.  There are probably better treatments that could be used to keep your asthma under control.     HOME CARE Only take medications as instructed by your medical team. Complete the entire course of an antibiotic. Drink plenty of fluids and get plenty of rest. Avoid close contacts especially the very young and the elderly Cover your mouth if you cough or cough into your sleeve. Always remember to wash your hands A steam or ultrasonic humidifier can help congestion.   GET HELP RIGHT AWAY IF: You develop worsening fever. You become short of breath You cough up blood. Your symptoms persist after you have completed your treatment plan MAKE SURE YOU  Understand these instructions. Will watch your condition. Will get help right away if you are not doing well or get worse.    Thank you for choosing an e-visit.  Your e-visit answers were reviewed by a board certified advanced clinical practitioner to complete your personal care plan. Depending upon the condition, your plan could have included both over the counter or prescription medications.  Please review your pharmacy choice. Make sure the pharmacy is open so you can pick up prescription now. If there is a  problem, you may contact your provider through [employer] and have the prescription routed to another pharmacy.  Your safety is important to [place]. If you have drug allergies check your prescription carefully.   For the next 24 hours you can use MyChart to ask questions about today's visit, request a non-urgent call back, or ask for a work or school excuse. You will get an email in the next two days asking about  your experience. I hope that your e-visit has been valuable and will speed your recovery.  I provided 5 minutes of non face-to-face time during this encounter for chart review and documentation.

## 2021-04-26 ENCOUNTER — Telehealth: Payer: Self-pay | Admitting: Physician Assistant

## 2021-04-26 ENCOUNTER — Other Ambulatory Visit (HOSPITAL_COMMUNITY): Payer: Self-pay

## 2021-04-26 ENCOUNTER — Telehealth: Payer: No Typology Code available for payment source | Admitting: Physician Assistant

## 2021-04-26 DIAGNOSIS — J029 Acute pharyngitis, unspecified: Secondary | ICD-10-CM

## 2021-04-26 DIAGNOSIS — J40 Bronchitis, not specified as acute or chronic: Secondary | ICD-10-CM | POA: Diagnosis not present

## 2021-04-26 MED ORDER — FLUTICASONE-SALMETEROL 250-50 MCG/ACT IN AEPB
1.0000 | INHALATION_SPRAY | Freq: Two times a day (BID) | RESPIRATORY_TRACT | 0 refills | Status: DC
  Filled 2021-04-26: qty 60, 30d supply, fill #0

## 2021-04-26 MED ORDER — PREDNISONE 20 MG PO TABS
40.0000 mg | ORAL_TABLET | Freq: Every day | ORAL | 0 refills | Status: DC
  Filled 2021-04-26: qty 14, 7d supply, fill #0

## 2021-04-26 MED ORDER — PSEUDOEPH-BROMPHEN-DM 30-2-10 MG/5ML PO SYRP
5.0000 mL | ORAL_SOLUTION | Freq: Four times a day (QID) | ORAL | 0 refills | Status: DC | PRN
Start: 2021-04-26 — End: 2021-05-04
  Filled 2021-04-26: qty 120, 6d supply, fill #0

## 2021-04-26 NOTE — Progress Notes (Signed)
Virtual Visit Consent   Adrienne Church, you are scheduled for a virtual visit with a [facility] provider today.     Just as with appointments in the office, your consent must be obtained to participate.  Your consent will be active for this visit and any virtual visit you may have with one of our providers in the next 365 days.     If you have a MyChart account, a copy of this consent can be sent to you electronically.  All virtual visits are billed to your insurance company just like a traditional visit in the office.    As this is a virtual visit, video technology does not allow for your provider to perform a traditional examination.  This may limit your provider's ability to fully assess your condition.  If your provider identifies any concerns that need to be evaluated in person or the need to arrange testing (such as labs, EKG, etc.), we will make arrangements to do so.     Although advances in technology are sophisticated, we cannot ensure that it will always work on either your end or our end.  If the connection with a video visit is poor, the visit may have to be switched to a telephone visit.  With either a video or telephone visit, we are not always able to ensure that we have a secure connection.     I need to obtain your verbal consent now.   Are you willing to proceed with your visit today?    DANICA CAMARENA has provided verbal consent on 04/26/2021 for a virtual visit (video or telephone).   Margaretann Loveless, PA-C   Date: 04/26/2021 1:26 PM   Virtual Visit via Video Note   I, Margaretann Loveless, connected with  Adrienne Church  ([id number], 10-15-1985) on 04/26/21 at  1:15 PM EST by a video-enabled telemedicine application and verified that I am speaking with the correct person using two identifiers.  Location: Patient: Virtual Visit Location Patient: Other: work; isolated Provider: Engineer, mining Provider: Home Office   I discussed the limitations of  evaluation and management by telemedicine and the availability of in person appointments. The patient expressed understanding and agreed to proceed.    History of Present Illness: Adrienne Church is a 35 y.o. who identifies as a female who was assigned female at birth, and is being seen today for recurrent sore throat. Had been placed on Amoxicillin for strep throat around 04/19/21. She then completed an EV on 04/22/21 and had prednisone added and bromfed DM added. She has completed the antibiotic and prednisone and had symptoms improved. She reports that she has realized she worked in this one environment prior to the strep throat starting. She has not been in that environment and had improvement in symptoms. She has been back in that environment for 2 days and has started noticing cough is worsening and starting to have sore throat again.    Problems:  Patient Active Problem List   Diagnosis Date Noted   Mitral valve prolapse 01/07/2019   GAD (generalized anxiety disorder) 09/06/2017   Attention deficit disorder 08/13/2017   Infertility counseling 11/22/2015   Anxiety 11/01/2015   Multiple allergies 11/01/2015    Allergies:  Allergies  Allergen Reactions   Azithromycin Other (See Comments)    other   Medications:  Current Outpatient Medications:    brompheniramine-pseudoephedrine-DM 30-2-10 MG/5ML syrup, Take 5 mLs by mouth 4 (four) times daily as needed., Disp: 120  mL, Rfl: 0   fluticasone-salmeterol (ADVAIR DISKUS) 250-50 MCG/ACT AEPB, Inhale 1 puff into the lungs in the morning and at bedtime., Disp: 60 each, Rfl: 0   predniSONE (DELTASONE) 20 MG tablet, Take 2 tablets (40 mg total) by mouth daily with breakfast., Disp: 14 tablet, Rfl: 0   albuterol (VENTOLIN HFA) 108 (90 Base) MCG/ACT inhaler, Inhale 2 puffs into the lungs every 6 (six) hours as needed for wheezing or shortness of breath., Disp: 8 g, Rfl: 0   ALPRAZolam (XANAX) 0.5 MG tablet, Take 0.5-1 tablets (0.25-0.5 mg total) by  mouth 2 (two) times daily as needed. CAUTION: DROWSINESS, Disp: 30 tablet, Rfl: 4   Amphet-Dextroamphet 3-Bead ER (MYDAYIS) 50 MG CP24, Take by mouth. (Patient not taking: Reported on 01/07/2020), Disp: , Rfl:    fluconazole (DIFLUCAN) 150 MG tablet, TAKE 1 TABLET BY MOUTH NOW THEN REPEAT IN 3 DAYS AS NEEDED, Disp: 2 tablet, Rfl: 1   fluocinonide cream (LIDEX) 0.05 %, APPLY TO AFFECTED AREAS AS NEEDED, Disp: 60 g, Rfl: 3   hydrOXYzine (ATARAX/VISTARIL) 25 MG tablet, TAKE 1 TABLET BY MOUTH THREE TIMES DAILY AS NEEDED, Disp: 90 tablet, Rfl: 1   ketoconazole (NIZORAL) 2 % cream, Apply 1 application topically 2 (two) times daily as needed for irritation., Disp: 30 g, Rfl: 0   labetalol (NORMODYNE) 100 MG tablet, Take 100 mg by mouth 2 (two) times daily., Disp: , Rfl:    lamoTRIgine (LAMICTAL) 100 MG tablet, Take 1 tablet (100 mg total) by mouth at bedtime., Disp: 90 tablet, Rfl: 0   letrozole (FEMARA) 2.5 MG tablet, Take 2.5 mg by mouth daily., Disp: , Rfl:    Lisdexamfetamine Dimesylate (VYVANSE) 60 MG CHEW, Chew 1 tablet (60 mg) by mouth daily., Disp: 30 tablet, Rfl: 0   Lisdexamfetamine Dimesylate (VYVANSE) 60 MG CHEW, Chew 1 tablet by mouth daily. 04/11/21, Disp: 30 tablet, Rfl: 0   Lisdexamfetamine Dimesylate (VYVANSE) 60 MG CHEW, Chew 1 tablet (60 mg) by mouth daily., Disp: 30 tablet, Rfl: 0   Lisdexamfetamine Dimesylate (VYVANSE) 60 MG CHEW, Chew 1 tablet (60 mg) by mouth daily., Disp: 30 tablet, Rfl: 0   metFORMIN (GLUCOPHAGE) 1000 MG tablet, Take 0.5 tablet by mouth every morning for 3 days, THEN 0.5 tablet 2 times daily for 3 days, THEN 1 tablet in the morning and 0.5 tablet in the evening for 3 days, THEN 1 tablet 2 times daily in the morning and evening, Disp: 60 tablet, Rfl: 11   metFORMIN (GLUCOPHAGE-XR) 500 MG 24 hr tablet, Take 500 mg by mouth daily with breakfast., Disp: , Rfl:    Norgestimate-Ethinyl Estradiol Triphasic 0.18/0.215/0.25 MG-25 MCG tab, Take 1 tablet by mouth daily., Disp: 1  Package, Rfl: 5   ondansetron (ZOFRAN ODT) 4 MG disintegrating tablet, Take one tablet po every 6 hours prn nausea, Disp: 16 tablet, Rfl: 0   Prenatal Multivit-Min-Fe-FA (PRE-NATAL PO), Take 1 tablet by mouth daily., Disp: , Rfl:    promethazine (PHENERGAN) 25 MG tablet, Take 1 tablet (25 mg total) by mouth every 6 (six) hours as needed for nausea or vomiting., Disp: 30 tablet, Rfl: 0   sertraline (ZOLOFT) 50 MG tablet, TAKE 1 TABLET BY MOUTH EVERY DAY, Disp: 90 tablet, Rfl: 1   silver sulfADIAZINE (SILVADENE) 1 % cream, Apply daily until healed, Disp: 50 g, Rfl: 0   valACYclovir (VALTREX) 1000 MG tablet, Take 2 tablets (2,000 mg total) by mouth now and take 2 tablets in 12 hours, Disp: 4 tablet, Rfl: 5  Observations/Objective:  Patient is well-developed, well-nourished in no acute distress.  Resting comfortably  at home.  Head is normocephalic, atraumatic.  No labored breathing.  Speech is clear and coherent with logical content.  Patient is alert and oriented at baseline.  Dry cough heard  Assessment and Plan: 1. Bronchitis - predniSONE (DELTASONE) 20 MG tablet; Take 2 tablets (40 mg total) by mouth daily with breakfast.  Dispense: 14 tablet; Refill: 0 - fluticasone-salmeterol (ADVAIR DISKUS) 250-50 MCG/ACT AEPB; Inhale 1 puff into the lungs in the morning and at bedtime.  Dispense: 60 each; Refill: 0 - brompheniramine-pseudoephedrine-DM 30-2-10 MG/5ML syrup; Take 5 mLs by mouth 4 (four) times daily as needed.  Dispense: 120 mL; Refill: 0  - Suspect asthmatic bronchitis response to a suspected environmental allergen - Will add prednisone and Advair - Continue Albuterol PRN - Bromfed-DM refilled - Push fluids - Seek in person evaluation if symptoms persist or worsen  Follow Up Instructions: I discussed the assessment and treatment plan with the patient. The patient was provided an opportunity to ask questions and all were answered. The patient agreed with the plan and demonstrated an  understanding of the instructions.  A copy of instructions were sent to the patient via MyChart unless otherwise noted below.    The patient was advised to call back or seek an in-person evaluation if the symptoms worsen or if the condition fails to improve as anticipated.  Time:  I spent 12 minutes with the patient via telehealth technology discussing the above problems/concerns.    Margaretann Loveless, PA-C

## 2021-04-26 NOTE — Patient Instructions (Signed)
Vonna Drafts, thank you for joining Margaretann Loveless, PA-C for today's virtual visit.  While this provider is not your primary care provider (PCP), if your PCP is located in our provider database this encounter information will be shared with them immediately following your visit.  Consent: (Patient) Adrienne Church provided verbal consent for this virtual visit at the beginning of the encounter.  Current Medications:  Current Outpatient Medications:    brompheniramine-pseudoephedrine-DM 30-2-10 MG/5ML syrup, Take 5 mLs by mouth 4 (four) times daily as needed., Disp: 120 mL, Rfl: 0   fluticasone-salmeterol (ADVAIR DISKUS) 250-50 MCG/ACT AEPB, Inhale 1 puff into the lungs in the morning and at bedtime., Disp: 60 each, Rfl: 0   predniSONE (DELTASONE) 20 MG tablet, Take 2 tablets (40 mg total) by mouth daily with breakfast., Disp: 14 tablet, Rfl: 0   albuterol (VENTOLIN HFA) 108 (90 Base) MCG/ACT inhaler, Inhale 2 puffs into the lungs every 6 (six) hours as needed for wheezing or shortness of breath., Disp: 8 g, Rfl: 0   ALPRAZolam (XANAX) 0.5 MG tablet, Take 0.5-1 tablets (0.25-0.5 mg total) by mouth 2 (two) times daily as needed. CAUTION: DROWSINESS, Disp: 30 tablet, Rfl: 4   Amphet-Dextroamphet 3-Bead ER (MYDAYIS) 50 MG CP24, Take by mouth. (Patient not taking: Reported on 01/07/2020), Disp: , Rfl:    fluconazole (DIFLUCAN) 150 MG tablet, TAKE 1 TABLET BY MOUTH NOW THEN REPEAT IN 3 DAYS AS NEEDED, Disp: 2 tablet, Rfl: 1   fluocinonide cream (LIDEX) 0.05 %, APPLY TO AFFECTED AREAS AS NEEDED, Disp: 60 g, Rfl: 3   hydrOXYzine (ATARAX/VISTARIL) 25 MG tablet, TAKE 1 TABLET BY MOUTH THREE TIMES DAILY AS NEEDED, Disp: 90 tablet, Rfl: 1   ketoconazole (NIZORAL) 2 % cream, Apply 1 application topically 2 (two) times daily as needed for irritation., Disp: 30 g, Rfl: 0   labetalol (NORMODYNE) 100 MG tablet, Take 100 mg by mouth 2 (two) times daily., Disp: , Rfl:    lamoTRIgine (LAMICTAL) 100 MG tablet,  Take 1 tablet (100 mg total) by mouth at bedtime., Disp: 90 tablet, Rfl: 0   letrozole (FEMARA) 2.5 MG tablet, Take 2.5 mg by mouth daily., Disp: , Rfl:    Lisdexamfetamine Dimesylate (VYVANSE) 60 MG CHEW, Chew 1 tablet (60 mg) by mouth daily., Disp: 30 tablet, Rfl: 0   Lisdexamfetamine Dimesylate (VYVANSE) 60 MG CHEW, Chew 1 tablet by mouth daily. 04/11/21, Disp: 30 tablet, Rfl: 0   Lisdexamfetamine Dimesylate (VYVANSE) 60 MG CHEW, Chew 1 tablet (60 mg) by mouth daily., Disp: 30 tablet, Rfl: 0   Lisdexamfetamine Dimesylate (VYVANSE) 60 MG CHEW, Chew 1 tablet (60 mg) by mouth daily., Disp: 30 tablet, Rfl: 0   metFORMIN (GLUCOPHAGE) 1000 MG tablet, Take 0.5 tablet by mouth every morning for 3 days, THEN 0.5 tablet 2 times daily for 3 days, THEN 1 tablet in the morning and 0.5 tablet in the evening for 3 days, THEN 1 tablet 2 times daily in the morning and evening, Disp: 60 tablet, Rfl: 11   metFORMIN (GLUCOPHAGE-XR) 500 MG 24 hr tablet, Take 500 mg by mouth daily with breakfast., Disp: , Rfl:    Norgestimate-Ethinyl Estradiol Triphasic 0.18/0.215/0.25 MG-25 MCG tab, Take 1 tablet by mouth daily., Disp: 1 Package, Rfl: 5   ondansetron (ZOFRAN ODT) 4 MG disintegrating tablet, Take one tablet po every 6 hours prn nausea, Disp: 16 tablet, Rfl: 0   Prenatal Multivit-Min-Fe-FA (PRE-NATAL PO), Take 1 tablet by mouth daily., Disp: , Rfl:    promethazine (PHENERGAN)  25 MG tablet, Take 1 tablet (25 mg total) by mouth every 6 (six) hours as needed for nausea or vomiting., Disp: 30 tablet, Rfl: 0   sertraline (ZOLOFT) 50 MG tablet, TAKE 1 TABLET BY MOUTH EVERY DAY, Disp: 90 tablet, Rfl: 1   silver sulfADIAZINE (SILVADENE) 1 % cream, Apply daily until healed, Disp: 50 g, Rfl: 0   valACYclovir (VALTREX) 1000 MG tablet, Take 2 tablets (2,000 mg total) by mouth now and take 2 tablets in 12 hours, Disp: 4 tablet, Rfl: 5   Medications ordered in this encounter:  Meds ordered this encounter  Medications   predniSONE  (DELTASONE) 20 MG tablet    Sig: Take 2 tablets (40 mg total) by mouth daily with breakfast.    Dispense:  14 tablet    Refill:  0    Order Specific Question:   Supervising Provider    Answer:   MILLER, BRIAN [[id number]]   fluticasone-salmeterol (ADVAIR DISKUS) 250-50 MCG/ACT AEPB    Sig: Inhale 1 puff into the lungs in the morning and at bedtime.    Dispense:  60 each    Refill:  0    Order Specific Question:   Supervising Provider    Answer:   Hyacinth Meeker, BRIAN [[id number]]   brompheniramine-pseudoephedrine-DM 30-2-10 MG/5ML syrup    Sig: Take 5 mLs by mouth 4 (four) times daily as needed.    Dispense:  120 mL    Refill:  0    Order Specific Question:   Supervising Provider    Answer:   Hyacinth Meeker, BRIAN [[id number]]     *If you need refills on other medications prior to your next appointment, please contact your pharmacy*  Follow-Up: Call back or seek an in-person evaluation if the symptoms worsen or if the condition fails to improve as anticipated.  Other Instructions Acute Bronchitis, Adult Acute bronchitis is sudden inflammation of the main airways (bronchi) that come off the windpipe (trachea) in the lungs. The swelling causes the airways to get smaller and make more mucus than normal. This can make it hard to breathe and can cause coughing or noisy breathing (wheezing). Acute bronchitis may last several weeks. The cough may last longer. Allergies, asthma, and exposure to smoke may make the condition worse. What are the causes? This condition can be caused by germs and by substances that irritate the lungs, including: Cold and flu viruses. The most common cause of this condition is the virus that causes the common cold. Bacteria. This is less common. Breathing in substances that irritate the lungs, including: Smoke from cigarettes and other forms of tobacco. Dust and pollen. Fumes from household cleaning products, gases, or burned fuel. Indoor or outdoor air pollution. What increases the  risk? The following factors may make you more likely to develop this condition: A weak body's defense system, also called the immune system. A condition that affects your lungs and breathing, such as asthma. What are the signs or symptoms? Common symptoms of this condition include: Coughing. This may bring up clear, yellow, or green mucus from your lungs (sputum). Wheezing. Runny or stuffy nose. Having too much mucus in your lungs (chest congestion). Shortness of breath. Aches and pains, including sore throat or chest. How is this diagnosed? This condition is usually diagnosed based on: Your symptoms and medical history. A physical exam. You may also have other tests, including tests to rule out other conditions, such as pneumonia. These tests include: A test of lung function. Test of a mucus sample to  look for the presence of bacteria. Tests to check the oxygen level in your blood. Blood tests. Chest X-ray. How is this treated? Most cases of acute bronchitis clear up over time without treatment. Your health care provider may recommend: Drinking more fluids to help thin your mucus so it is easier to cough up. Taking inhaled medicine (inhaler) to improve air flow in and out of your lungs. Using a vaporizer or a humidifier. These are machines that add water to the air to help you breathe better. Taking a medicine that thins mucus and clears congestion (expectorant). Taking a medicine that prevents or stops coughing (cough suppressant). It is notcommon to take an antibiotic medicine for this condition. Follow these instructions at home:  Take over-the-counter and prescription medicines only as told by your health care provider. Use an inhaler, vaporizer, or humidifier as told by your health care provider. Take two teaspoons (10 mL) of honey at bedtime to lessen coughing at night. Drink enough fluid to keep your urine pale yellow. Do not use any products that contain nicotine or  tobacco. These products include cigarettes, chewing tobacco, and vaping devices, such as e-cigarettes. If you need help quitting, ask your health care provider. Get plenty of rest. Return to your normal activities as told by your health care provider. Ask your health care provider what activities are safe for you. Keep all follow-up visits. This is important. How is this prevented? To lower your risk of getting this condition again: Wash your hands often with soap and water for at least 20 seconds. If soap and water are not available, use hand sanitizer. Avoid contact with people who have cold symptoms. Try not to touch your mouth, nose, or eyes with your hands. Avoid breathing in smoke or chemical fumes. Breathing smoke or chemical fumes will make your condition worse. Get the flu shot every year. Contact a health care provider if: Your symptoms do not improve after 2 weeks. You have trouble coughing up the mucus. Your cough keeps you awake at night. You have a fever. Get help right away if you: Cough up blood. Feel pain in your chest. Have severe shortness of breath. Faint or keep feeling like you are going to faint. Have a severe headache. Have a fever or chills that get worse. These symptoms may represent a serious problem that is an emergency. Do not wait to see if the symptoms will go away. Get medical help right away. Call your local emergency services (911 in the [place] Do not drive yourself to the hospital. Summary Acute bronchitis is inflammation of the main airways (bronchi) that come off the windpipe (trachea) in the lungs. The swelling causes the airways to get smaller and make more mucus than normal. Drinking more fluids can help thin your mucus so it is easier to cough up. Take over-the-counter and prescription medicines only as told by your health care provider. Do not use any products that contain nicotine or tobacco. These products include cigarettes, chewing tobacco,  and vaping devices, such as e-cigarettes. If you need help quitting, ask your health care provider. Contact a health care provider if your symptoms do not improve after 2 weeks. This information is not intended to replace advice given to you by your health care provider. Make sure you discuss any questions you have with your health care provider. Document Revised: 08/25/2020 Document Reviewed: 08/25/2020 Elsevier Patient Education  2022 [employer].    If you have been instructed to have an in-person evaluation  today at a local Urgent Care facility, please use the link below. It will take you to a list of all of our available [facility], including address, phone number and hours of operation. Please do not delay care.  [facility]  If you or a family member do not have a primary care provider, use the link below to schedule a visit and establish care. When you choose a [facility] primary care physician or advanced practice provider, you gain a long-term partner in health. Find a Primary Care Provider  Learn more about 's in-office and virtual care options: [facility]

## 2021-04-26 NOTE — Progress Notes (Signed)
Based on what you shared with me, I feel your condition warrants further evaluation and I recommend that you be seen in a face to face visit.   NOTE: There will be NO CHARGE for this eVisit   If you are having a true medical emergency please call 911.      For an urgent face to face visit, [facility] has six urgent care centers for your convenience:     [facility] [place] Get Driving Directions [phone] [address] [place], [place] [address]    [facility] ([place]) Get Driving Directions [phone] [address] [place], [place] [address]  [facility] ([place] [facility] [place]) Get Driving Directions [phone] [address] [place],  [place]  [address]  [facility] Get Driving Directions [phone] [address] [place], [place] [address]   [facility] Get Driving Directions  [phone] [address] [place], [place] [address]    [facility] Get Driving Directions [phone] [address] [place],  [address]  Your MyChart E-visit questionnaire answers were reviewed by a board certified advanced clinical practitioner to complete your personal care plan based on your specific symptoms.  Thank you for using e-Visits.   I provided 5 minutes of non face-to-face time during this encounter for chart review and documentation.   

## 2021-04-28 ENCOUNTER — Encounter: Payer: Self-pay | Admitting: Physician Assistant

## 2021-05-04 ENCOUNTER — Other Ambulatory Visit: Payer: Self-pay

## 2021-05-04 ENCOUNTER — Encounter: Payer: Self-pay | Admitting: Family Medicine

## 2021-05-04 ENCOUNTER — Other Ambulatory Visit (HOSPITAL_COMMUNITY): Payer: Self-pay

## 2021-05-04 ENCOUNTER — Ambulatory Visit (INDEPENDENT_AMBULATORY_CARE_PROVIDER_SITE_OTHER): Payer: No Typology Code available for payment source | Admitting: Family Medicine

## 2021-05-04 VITALS — HR 111 | Temp 98.0°F | Ht 65.0 in | Wt 180.0 lb

## 2021-05-04 DIAGNOSIS — J019 Acute sinusitis, unspecified: Secondary | ICD-10-CM | POA: Diagnosis not present

## 2021-05-04 MED ORDER — AMOXICILLIN-POT CLAVULANATE 875-125 MG PO TABS: 1.0000 | ORAL_TABLET | Freq: Two times a day (BID) | ORAL | 0 refills | Status: DC

## 2021-05-04 MED ORDER — HYDROCODONE BIT-HOMATROP MBR 5-1.5 MG/5ML PO SOLN: 5.0000 mL | Freq: Four times a day (QID) | ORAL | 0 refills | Status: DC | PRN

## 2021-05-04 MED ORDER — HYDROCODONE BIT-HOMATROP MBR 5-1.5 MG/5ML PO SOLN
5.0000 mL | Freq: Four times a day (QID) | ORAL | 0 refills | Status: AC | PRN
  Filled 2021-05-04: qty 90, 5d supply, fill #0

## 2021-05-04 NOTE — Telephone Encounter (Signed)
Nurses Please confirm with patient [facility] outpatient pharmacy? [facility] outpatient pharmacy?  Then please put the refill within the refill requisition orders and I will sign off on them

## 2021-05-04 NOTE — Progress Notes (Signed)
° °  Subjective:    Patient ID: Adrienne Church, female    DOB: June 22, 1985, 35 y.o.   MRN: [id number]  HPI Follow up cough - mostly dry - non productive for about 2 weeks - completed 2 rounds of ABX Ear pain bilateral  Relates some head congestion drainage no wheezing or difficulty breathing  Review of Systems     Objective:   Physical Exam Gen-NAD not toxic TMS-normal bilateral T- normal no redness Chest-CTA respiratory rate normal no crackles CV RRR no murmur Skin-warm dry Neuro-grossly normal        Assessment & Plan:  Persistent illness Could well of been a virus that is turned into a sinus infection Antibiotic prescribed warning signs discussed Follow-up if progressive troubles or worse

## 2021-05-09 ENCOUNTER — Other Ambulatory Visit (HOSPITAL_COMMUNITY): Payer: Self-pay

## 2021-05-25 ENCOUNTER — Other Ambulatory Visit (HOSPITAL_COMMUNITY): Payer: Self-pay

## 2021-06-07 ENCOUNTER — Other Ambulatory Visit (HOSPITAL_COMMUNITY): Payer: Self-pay

## 2021-06-07 ENCOUNTER — Other Ambulatory Visit: Payer: Self-pay | Admitting: Family Medicine

## 2021-06-07 MED ORDER — VYVANSE 60 MG PO CHEW
60.0000 mg | CHEWABLE_TABLET | Freq: Every day | ORAL | 0 refills | Status: DC
  Filled 2021-06-07 – 2021-06-10 (×2): qty 30, 30d supply, fill #0

## 2021-06-10 ENCOUNTER — Other Ambulatory Visit (HOSPITAL_COMMUNITY): Payer: Self-pay

## 2021-06-23 ENCOUNTER — Other Ambulatory Visit (HOSPITAL_COMMUNITY): Payer: Self-pay

## 2021-06-23 ENCOUNTER — Telehealth: Payer: No Typology Code available for payment source | Admitting: Family Medicine

## 2021-06-23 DIAGNOSIS — R112 Nausea with vomiting, unspecified: Secondary | ICD-10-CM | POA: Diagnosis not present

## 2021-06-23 MED ORDER — ONDANSETRON HCL 4 MG PO TABS
4.0000 mg | ORAL_TABLET | Freq: Three times a day (TID) | ORAL | 0 refills | Status: DC | PRN
  Filled 2021-06-23: qty 15, 5d supply, fill #0

## 2021-06-23 NOTE — Progress Notes (Signed)
E-Visit for Vomiting  We are sorry that you are not feeling well. Here is how we plan to help!  Based on what you have shared with me it looks like you have a Virus that is irritating your GI tract.  Vomiting is the forceful emptying of a portion of the stomach's content through the mouth.  Although nausea and vomiting can make you feel miserable, it's important to remember that these are not diseases, but rather symptoms of an underlying illness.  When we treat short term symptoms, we always caution that any symptoms that persist should be fully evaluated in a medical office.  I have prescribed a medication that will help alleviate your symptoms and allow you to stay hydrated:  Zofran 4 mg 1 tablet every 8 hours as needed for nausea and vomiting  HOME CARE: Drink clear liquids.  This is very important! Dehydration (the lack of fluid) can lead to a serious complication.  Start off with 1 tablespoon every 5 minutes for 8 hours. You may begin eating bland foods after 8 hours without vomiting.  Start with saltine crackers, white bread, rice, mashed potatoes, applesauce. After 48 hours on a bland diet, you may resume a normal diet. Try to go to sleep.  Sleep often empties the stomach and relieves the need to vomit.  GET HELP RIGHT AWAY IF:  Your symptoms do not improve or worsen within 2 days after treatment. You have a fever for over 3 days. You cannot keep down fluids after trying the medication.  MAKE SURE YOU:  Understand these instructions. Will watch your condition. Will get help right away if you are not doing well or get worse.   Thank you for choosing an e-visit.  Your e-visit answers were reviewed by a board certified advanced clinical practitioner to complete your personal care plan. Depending upon the condition, your plan could have included both over the counter or prescription medications.  Please review your pharmacy choice. Make sure the pharmacy is open so you can pick  up prescription now. If there is a problem, you may contact your provider through MyChart messaging and have the prescription routed to another pharmacy.  Your safety is important to us. If you have drug allergies check your prescription carefully.   For the next 24 hours you can use MyChart to ask questions about today's visit, request a non-urgent call back, or ask for a work or school excuse. You will get an email in the next two days asking about your experience. I hope that your e-visit has been valuable and will speed your recovery.  I provided 5 minutes of non face-to-face time during this encounter for chart review, medication and order placement, as well as and documentation.   

## 2021-07-07 ENCOUNTER — Other Ambulatory Visit: Payer: Self-pay | Admitting: Family Medicine

## 2021-07-07 ENCOUNTER — Other Ambulatory Visit (HOSPITAL_COMMUNITY): Payer: Self-pay

## 2021-07-08 ENCOUNTER — Other Ambulatory Visit (HOSPITAL_COMMUNITY): Payer: Self-pay

## 2021-07-08 MED ORDER — LAMOTRIGINE 100 MG PO TABS
100.0000 mg | ORAL_TABLET | Freq: Every day | ORAL | 0 refills | Status: DC
  Filled 2021-07-08: qty 90, 90d supply, fill #0

## 2021-07-08 MED ORDER — VYVANSE 60 MG PO CHEW
60.0000 mg | CHEWABLE_TABLET | Freq: Every day | ORAL | 0 refills | Status: DC
  Filled 2021-07-08: qty 15, 15d supply, fill #0

## 2021-07-18 ENCOUNTER — Telehealth: Payer: Self-pay

## 2021-07-18 NOTE — Telephone Encounter (Signed)
Pt called in wanting to discuss a bill that she has been receiving. Pt just wants to be sure that her insurance is on file and the claims are being sent. Please advise. ? ?Cb#: [phone] ?

## 2021-07-20 ENCOUNTER — Ambulatory Visit: Payer: Self-pay | Admitting: Nurse Practitioner

## 2021-07-26 ENCOUNTER — Other Ambulatory Visit: Payer: Self-pay

## 2021-07-26 ENCOUNTER — Ambulatory Visit (INDEPENDENT_AMBULATORY_CARE_PROVIDER_SITE_OTHER): Payer: No Typology Code available for payment source | Admitting: Nurse Practitioner

## 2021-07-26 ENCOUNTER — Encounter: Payer: Self-pay | Admitting: Nurse Practitioner

## 2021-07-26 ENCOUNTER — Other Ambulatory Visit (HOSPITAL_COMMUNITY): Payer: Self-pay

## 2021-07-26 VITALS — BP 125/85 | HR 88 | Temp 97.9°F | Wt 173.8 lb

## 2021-07-26 DIAGNOSIS — F988 Other specified behavioral and emotional disorders with onset usually occurring in childhood and adolescence: Secondary | ICD-10-CM | POA: Diagnosis not present

## 2021-07-26 MED ORDER — VYVANSE 60 MG PO CHEW
60.0000 mg | CHEWABLE_TABLET | Freq: Every day | ORAL | 0 refills | Status: DC
  Filled 2021-07-26: qty 30, 30d supply, fill #0

## 2021-07-26 MED ORDER — FLUOCINONIDE 0.05 % EX CREA
TOPICAL_CREAM | CUTANEOUS | 3 refills | Status: DC | PRN
  Filled 2021-07-26: qty 60, 30d supply, fill #0
  Filled 2021-12-28: qty 60, 30d supply, fill #1
  Filled 2022-03-20: qty 60, 30d supply, fill #2

## 2021-07-26 NOTE — Progress Notes (Signed)
? ?  Subjective:  ? ? Patient ID: Adrienne Church, female    DOB: 18-Dec-1985, 36 y.o.   MRN: [id number] ? ?HPI ? ?36 year old female patient with history of anxiety and ADD presents to the clinic for med check.  Patient was started on Vyvanse by attention specialist and states that it works a lot better than Adderall.  Patient states that she would like to continue taking Vyvanse 60 mg daily.  Patient states that she is currently using chewable Vyvanse as that is what her insurance will cover.  Patient has no concerns about Vyvanse. ? ? ?Review of Systems  ?All other systems reviewed and are negative. ? ?   ?Objective:  ? Physical Exam ?Vitals reviewed.  ?Constitutional:   ?   General: She is not in acute distress. ?   Appearance: Normal appearance. She is normal weight. She is not ill-appearing, toxic-appearing or diaphoretic.  ?Cardiovascular:  ?   Rate and Rhythm: Normal rate and regular rhythm.  ?   Pulses: Normal pulses.  ?   Heart sounds: Normal heart sounds. No murmur heard. ?  No friction rub. No gallop.  ?Pulmonary:  ?   Effort: Pulmonary effort is normal. No respiratory distress.  ?   Breath sounds: Normal breath sounds. No stridor. No wheezing, rhonchi or rales.  ?Chest:  ?   Chest wall: No tenderness.  ?Musculoskeletal:     ?   General: Normal range of motion.  ?Skin: ?   General: Skin is warm and dry.  ?   Capillary Refill: Capillary refill takes less than 2 seconds.  ?Neurological:  ?   General: No focal deficit present.  ?   Mental Status: She is alert and oriented to person, place, and time.  ?Psychiatric:     ?   Mood and Affect: Mood normal.     ?   Behavior: Behavior normal.  ? ? ? ? ? ?   ?Assessment & Plan:  ? ?1. Attention deficit disorder, unspecified hyperactivity presence ?-Vyvanse 60 mg chewable tablets reordered 30-day supply. ?-Patient to follow-up with Dr. Lorin Picket in 4 weeks. ?-Patient has no other concerns ? ?  ?Note:  This document was prepared using Dragon voice recognition software and  may include unintentional dictation errors. ? ? ?

## 2021-07-28 ENCOUNTER — Other Ambulatory Visit (HOSPITAL_COMMUNITY): Payer: Self-pay

## 2021-08-02 NOTE — Telephone Encounter (Signed)
I have reviewed patients account and I don't see that we have her insurance on file. I have left vm asking pt to return my call so we can get her insurance information. ?

## 2021-08-04 ENCOUNTER — Other Ambulatory Visit (HOSPITAL_COMMUNITY): Payer: Self-pay

## 2021-08-04 ENCOUNTER — Other Ambulatory Visit: Payer: Self-pay | Admitting: Family Medicine

## 2021-08-05 ENCOUNTER — Other Ambulatory Visit (HOSPITAL_COMMUNITY): Payer: Self-pay

## 2021-08-05 MED ORDER — ALPRAZOLAM 0.5 MG PO TABS
0.2500 mg | ORAL_TABLET | Freq: Two times a day (BID) | ORAL | 0 refills | Status: DC | PRN
  Filled 2021-08-05: qty 30, 15d supply, fill #0

## 2021-08-10 ENCOUNTER — Other Ambulatory Visit (HOSPITAL_COMMUNITY): Payer: Self-pay

## 2021-08-10 ENCOUNTER — Encounter: Payer: Self-pay | Admitting: Family Medicine

## 2021-08-10 ENCOUNTER — Other Ambulatory Visit: Payer: Self-pay | Admitting: *Deleted

## 2021-08-10 MED ORDER — FLUCONAZOLE 150 MG PO TABS
150.0000 mg | ORAL_TABLET | ORAL | 0 refills | Status: DC
  Filled 2021-08-10: qty 2, 4d supply, fill #0

## 2021-08-24 ENCOUNTER — Other Ambulatory Visit (HOSPITAL_COMMUNITY): Payer: Self-pay

## 2021-08-24 ENCOUNTER — Other Ambulatory Visit: Payer: Self-pay | Admitting: Nurse Practitioner

## 2021-08-26 ENCOUNTER — Encounter: Payer: Self-pay | Admitting: Family Medicine

## 2021-08-26 ENCOUNTER — Ambulatory Visit: Payer: No Typology Code available for payment source | Admitting: Family Medicine

## 2021-08-30 ENCOUNTER — Encounter: Payer: Self-pay | Admitting: Family Medicine

## 2021-08-31 ENCOUNTER — Other Ambulatory Visit: Payer: Self-pay | Admitting: Family Medicine

## 2021-08-31 ENCOUNTER — Other Ambulatory Visit (HOSPITAL_COMMUNITY): Payer: Self-pay

## 2021-08-31 MED ORDER — VYVANSE 60 MG PO CHEW
60.0000 mg | CHEWABLE_TABLET | Freq: Every day | ORAL | 0 refills | Status: DC
  Filled 2021-08-31: qty 30, 30d supply, fill #0

## 2021-09-28 ENCOUNTER — Other Ambulatory Visit: Payer: Self-pay | Admitting: Family Medicine

## 2021-09-29 ENCOUNTER — Other Ambulatory Visit (HOSPITAL_COMMUNITY): Payer: Self-pay

## 2021-09-29 MED ORDER — KETOCONAZOLE 2 % EX CREA
1.0000 "application " | TOPICAL_CREAM | Freq: Two times a day (BID) | CUTANEOUS | 0 refills | Status: AC | PRN
  Filled 2021-09-29: qty 30, 15d supply, fill #0

## 2021-09-29 MED ORDER — VYVANSE 60 MG PO CHEW
60.0000 mg | CHEWABLE_TABLET | Freq: Every day | ORAL | 0 refills | Status: DC
Start: 2021-09-29 — End: 2021-10-27
  Filled 2021-09-29: qty 30, 30d supply, fill #0

## 2021-09-29 MED ORDER — ALPRAZOLAM 0.5 MG PO TABS
0.2500 mg | ORAL_TABLET | Freq: Two times a day (BID) | ORAL | 0 refills | Status: DC | PRN
Start: 2021-09-29 — End: 2021-11-09
  Filled 2021-09-29: qty 30, 15d supply, fill #0

## 2021-10-04 ENCOUNTER — Other Ambulatory Visit: Payer: Self-pay | Admitting: Family Medicine

## 2021-10-05 ENCOUNTER — Other Ambulatory Visit (HOSPITAL_COMMUNITY): Payer: Self-pay

## 2021-10-05 MED ORDER — LAMOTRIGINE 100 MG PO TABS
100.0000 mg | ORAL_TABLET | Freq: Every day | ORAL | 0 refills | Status: DC
  Filled 2021-10-05: qty 79, 79d supply, fill #0
  Filled 2021-10-05: qty 11, 11d supply, fill #0

## 2021-10-27 ENCOUNTER — Other Ambulatory Visit: Payer: Self-pay | Admitting: Family Medicine

## 2021-10-28 ENCOUNTER — Other Ambulatory Visit (HOSPITAL_COMMUNITY): Payer: Self-pay

## 2021-10-31 ENCOUNTER — Other Ambulatory Visit: Payer: Self-pay | Admitting: Family Medicine

## 2021-10-31 ENCOUNTER — Other Ambulatory Visit (HOSPITAL_COMMUNITY): Payer: Self-pay

## 2021-10-31 MED ORDER — VYVANSE 60 MG PO CHEW
60.0000 mg | CHEWABLE_TABLET | Freq: Every day | ORAL | 0 refills | Status: DC
  Filled 2021-10-31: qty 30, 30d supply, fill #0

## 2021-11-09 ENCOUNTER — Encounter: Payer: Self-pay | Admitting: Family Medicine

## 2021-11-09 ENCOUNTER — Ambulatory Visit (INDEPENDENT_AMBULATORY_CARE_PROVIDER_SITE_OTHER): Payer: No Typology Code available for payment source | Admitting: Family Medicine

## 2021-11-09 ENCOUNTER — Other Ambulatory Visit (HOSPITAL_COMMUNITY): Payer: Self-pay

## 2021-11-09 VITALS — BP 125/87 | Wt 168.0 lb

## 2021-11-09 DIAGNOSIS — L83 Acanthosis nigricans: Secondary | ICD-10-CM | POA: Diagnosis not present

## 2021-11-09 DIAGNOSIS — F411 Generalized anxiety disorder: Secondary | ICD-10-CM

## 2021-11-09 DIAGNOSIS — F988 Other specified behavioral and emotional disorders with onset usually occurring in childhood and adolescence: Secondary | ICD-10-CM | POA: Diagnosis not present

## 2021-11-09 MED ORDER — ALPRAZOLAM 0.5 MG PO TABS
0.2500 mg | ORAL_TABLET | Freq: Two times a day (BID) | ORAL | 2 refills | Status: DC | PRN
  Filled 2021-11-09: qty 30, 15d supply, fill #0
  Filled 2021-12-28: qty 30, 15d supply, fill #1
  Filled 2022-01-26: qty 30, 15d supply, fill #2

## 2021-11-09 MED ORDER — VYVANSE 60 MG PO CHEW
60.0000 mg | CHEWABLE_TABLET | Freq: Every day | ORAL | 0 refills | Status: DC
  Filled 2021-11-09 – 2022-01-26 (×2): qty 30, 30d supply, fill #0

## 2021-11-09 MED ORDER — VYVANSE 60 MG PO CHEW
CHEWABLE_TABLET | ORAL | 0 refills | Status: DC
  Filled 2021-11-09: qty 30, fill #0
  Filled 2021-12-27: qty 30, 30d supply, fill #0

## 2021-11-09 MED ORDER — METFORMIN HCL ER 750 MG PO TB24
750.0000 mg | ORAL_TABLET | Freq: Every day | ORAL | 1 refills | Status: DC
  Filled 2021-11-09: qty 90, 90d supply, fill #0
  Filled 2022-02-17: qty 90, 90d supply, fill #1

## 2021-11-09 MED ORDER — VYVANSE 60 MG PO CHEW
CHEWABLE_TABLET | ORAL | 0 refills | Status: DC
  Filled 2021-11-09: qty 30, fill #0
  Filled 2021-11-28 (×2): qty 30, 30d supply, fill #0

## 2021-11-09 MED ORDER — LAMOTRIGINE 100 MG PO TABS
100.0000 mg | ORAL_TABLET | Freq: Every day | ORAL | 1 refills | Status: DC
  Filled 2021-11-09 – 2021-12-28 (×2): qty 90, 90d supply, fill #0

## 2021-11-09 NOTE — Progress Notes (Signed)
   Subjective:    Patient ID: Adrienne Church, female    DOB: Oct 15, 1985, 36 y.o.   MRN: [id number]  HPI This patient has adult ADD. Takes medication responsibly. Medication does help the patient focus in be more functional. Patient relates that they are or not abusing the medication or misusing the medication. The patient understands that if they're having any negative side effects such as elevated high blood pressure severe headaches they would need stop the medication follow-up immediately. They also understand that the prescriptions are to last for 3 months then the patient will need to follow-up before having further prescriptions.  Patient compliance Vyvanse   Does medication help patient function /attention better  yes  Side effects  none  She relates that the medication does allow her to stay more focused She denies abusing it Denies any negative side effects In addition to this she is using the metformin to help with acanthosis nigra cans She does use Xanax occasionally late in the day if she feels anxious and overwhelmed or has difficulty sleeping denies abusing it She also takes Lamictal on a daily basis this is doing well to help her with her moods Review of Systems     Objective:   Physical Exam  General-in no acute distress Eyes-no discharge Lungs-respiratory rate normal, CTA CV-no murmurs,RRR Extremities skin warm dry no edema Neuro grossly normal Behavior normal, alert       Assessment & Plan:  1. Attention deficit disorder, unspecified hyperactivity presence The patient was seen today as part of the visit regarding ADD.  Patient is stable on current regimen.  Appropriate prescriptions prescribed.  Medications were reviewed with the patient as well as compliance. Side effects were checked for. Discussion regarding effectiveness was held. Prescriptions were electronically sent in.  Patient reminded to follow-up in approximately 3 months.   Plans to [facility]  law with drug registry was checked and verified while present with the patient.   2. GAD (generalized anxiety disorder) Lamictal on a regular basis as directed  3. Acanthosis nigricans Patient is on metformin 1000 mg twice daily but she often does not take the second dose I recommend that she reduce this down to just once daily she states the other medicine does cause some nausea and occasional loose stools so we will try the extended release metformin  No need for labs on this visit we will do comprehensive labs by November

## 2021-11-23 ENCOUNTER — Other Ambulatory Visit (HOSPITAL_COMMUNITY): Payer: Self-pay

## 2021-11-23 MED ORDER — NYSTATIN 100000 UNIT/GM EX CREA
TOPICAL_CREAM | CUTANEOUS | 0 refills | Status: AC
  Filled 2021-11-23: qty 60, 30d supply, fill #0

## 2021-11-24 ENCOUNTER — Other Ambulatory Visit (HOSPITAL_COMMUNITY): Payer: Self-pay

## 2021-11-28 ENCOUNTER — Other Ambulatory Visit (HOSPITAL_COMMUNITY): Payer: Self-pay

## 2021-11-29 ENCOUNTER — Other Ambulatory Visit (HOSPITAL_COMMUNITY): Payer: Self-pay

## 2021-12-27 ENCOUNTER — Other Ambulatory Visit (HOSPITAL_COMMUNITY): Payer: Self-pay

## 2021-12-28 ENCOUNTER — Other Ambulatory Visit (HOSPITAL_COMMUNITY): Payer: Self-pay

## 2021-12-29 ENCOUNTER — Other Ambulatory Visit (HOSPITAL_COMMUNITY): Payer: Self-pay

## 2022-01-06 ENCOUNTER — Other Ambulatory Visit (HOSPITAL_COMMUNITY): Payer: Self-pay

## 2022-01-25 ENCOUNTER — Other Ambulatory Visit: Payer: Self-pay

## 2022-01-25 ENCOUNTER — Other Ambulatory Visit (HOSPITAL_COMMUNITY): Payer: Self-pay

## 2022-01-26 ENCOUNTER — Other Ambulatory Visit (HOSPITAL_COMMUNITY): Payer: Self-pay

## 2022-02-17 ENCOUNTER — Other Ambulatory Visit (HOSPITAL_COMMUNITY): Payer: Self-pay

## 2022-02-24 ENCOUNTER — Other Ambulatory Visit (HOSPITAL_COMMUNITY): Payer: Self-pay

## 2022-02-24 ENCOUNTER — Other Ambulatory Visit: Payer: Self-pay | Admitting: Family Medicine

## 2022-02-26 MED ORDER — LISDEXAMFETAMINE DIMESYLATE 60 MG PO CHEW
60.0000 mg | CHEWABLE_TABLET | Freq: Every day | ORAL | 0 refills | Status: DC
  Filled 2022-02-26 – 2022-02-27 (×2): qty 30, 30d supply, fill #0

## 2022-02-27 ENCOUNTER — Encounter: Payer: Self-pay | Admitting: Family Medicine

## 2022-02-27 ENCOUNTER — Other Ambulatory Visit (HOSPITAL_COMMUNITY): Payer: Self-pay

## 2022-02-27 ENCOUNTER — Other Ambulatory Visit: Payer: Self-pay | Admitting: Family Medicine

## 2022-02-27 MED ORDER — LISDEXAMFETAMINE DIMESYLATE 60 MG PO CHEW
CHEWABLE_TABLET | ORAL | 0 refills | Status: DC
  Filled 2022-02-27: qty 30, 30d supply, fill #0

## 2022-02-28 ENCOUNTER — Other Ambulatory Visit (HOSPITAL_COMMUNITY): Payer: Self-pay

## 2022-03-13 ENCOUNTER — Encounter: Payer: Self-pay | Admitting: Family Medicine

## 2022-03-13 ENCOUNTER — Ambulatory Visit (INDEPENDENT_AMBULATORY_CARE_PROVIDER_SITE_OTHER): Payer: No Typology Code available for payment source | Admitting: Family Medicine

## 2022-03-13 ENCOUNTER — Other Ambulatory Visit (HOSPITAL_COMMUNITY): Payer: Self-pay

## 2022-03-13 VITALS — BP 130/70 | Wt 168.6 lb

## 2022-03-13 DIAGNOSIS — E785 Hyperlipidemia, unspecified: Secondary | ICD-10-CM

## 2022-03-13 DIAGNOSIS — Z131 Encounter for screening for diabetes mellitus: Secondary | ICD-10-CM

## 2022-03-13 DIAGNOSIS — Z79899 Other long term (current) drug therapy: Secondary | ICD-10-CM | POA: Diagnosis not present

## 2022-03-13 DIAGNOSIS — L83 Acanthosis nigricans: Secondary | ICD-10-CM | POA: Diagnosis not present

## 2022-03-13 DIAGNOSIS — F988 Other specified behavioral and emotional disorders with onset usually occurring in childhood and adolescence: Secondary | ICD-10-CM

## 2022-03-13 MED ORDER — ALBUTEROL SULFATE HFA 108 (90 BASE) MCG/ACT IN AERS
2.0000 | INHALATION_SPRAY | Freq: Four times a day (QID) | RESPIRATORY_TRACT | 0 refills | Status: AC | PRN
  Filled 2022-03-13: qty 6.7, 25d supply, fill #0

## 2022-03-13 MED ORDER — METFORMIN HCL ER 750 MG PO TB24
750.0000 mg | ORAL_TABLET | Freq: Every day | ORAL | 1 refills | Status: DC
  Filled 2022-03-13 – 2022-05-24 (×2): qty 90, 90d supply, fill #0

## 2022-03-13 MED ORDER — LAMOTRIGINE 100 MG PO TABS
100.0000 mg | ORAL_TABLET | Freq: Every day | ORAL | 1 refills | Status: DC
  Filled 2022-03-13: qty 90, 90d supply, fill #0
  Filled 2022-06-23: qty 90, 90d supply, fill #1

## 2022-03-13 MED ORDER — LISDEXAMFETAMINE DIMESYLATE 40 MG PO CAPS
40.0000 mg | ORAL_CAPSULE | ORAL | 0 refills | Status: DC
  Filled 2022-03-13 – 2022-03-15 (×2): qty 30, 30d supply, fill #0

## 2022-03-13 NOTE — Progress Notes (Signed)
   Subjective:    Patient ID: Adrienne Church, female    DOB: 05/14/1985, 36 y.o.   MRN: [id number]  HPI This patient has adult ADD. Takes medication responsibly. Medication does help the patient focus in be more functional. Patient relates that they are or not abusing the medication or misusing the medication. The patient understands that if they're having any negative side effects such as elevated high blood pressure severe headaches they would need stop the medication follow-up immediately. They also understand that the prescriptions are to last for 3 months then the patient will need to follow-up before having further prescriptions.  Patient compliance: once daily Vyvanse   Does medication help patient function /attention better: yes  Side effects:none  Pt would like to change tablets.   Acanthosis nigricans - Plan: Lipid panel, Hepatic function panel, Basic metabolic panel, Hemoglobin A1c  Attention deficit disorder, unspecified hyperactivity presence  Hyperlipidemia, unspecified hyperlipidemia type - Plan: Lipid panel, Hepatic function panel, Basic metabolic panel, Hemoglobin A1c  High risk medication use - Plan: Lipid panel, Hepatic function panel, Basic metabolic panel, Hemoglobin A1c  Encounter for screening examination for impaired glucose regulation and diabetes mellitus - Plan: Lipid panel, Hepatic function panel, Basic metabolic panel, Hemoglobin A1c  ADD overall doing fairly well but the medicine is too strong She is trying to minimize her risk for diabetes and minimize hyperglycemia by eating healthy and taking her medicines Does have a history of hyperlipidemia needs to check lipid profile   Review of Systems     Objective:   Physical Exam  General-in no acute distress Eyes-no discharge Lungs-respiratory rate normal, CTA CV-no murmurs,RRR Extremities skin warm dry no edema Neuro grossly normal Behavior normal, alert       Assessment & Plan:  Patient feels  she is doing all right overall with the medicine but feels that the 60 mg dose is too much After shared discussion we will reduce this to 40 mg She will give [place] feedback in the next few weeks how this is doing If doing well we will send in 3 additional prescriptions She will follow-up in 4 months  Lab work for chronic health issues was ordered.  Await the results of this.  Patient is trying to eat healthy stay active take her medicines on a regular basis.

## 2022-03-14 ENCOUNTER — Encounter: Payer: Self-pay | Admitting: Family Medicine

## 2022-03-14 LAB — BASIC METABOLIC PANEL
BUN/Creatinine Ratio: 10 (ref 9–23)
BUN: 9 mg/dL (ref 6–20)
CO2: 20 mmol/L (ref 20–29)
Calcium: 9.5 mg/dL (ref 8.7–10.2)
Chloride: 103 mmol/L (ref 96–106)
Creatinine, Ser: 0.92 mg/dL (ref 0.57–1.00)
Glucose: 92 mg/dL (ref 70–99)
Potassium: 4.4 mmol/L (ref 3.5–5.2)
Sodium: 139 mmol/L (ref 134–144)
eGFR: 82 mL/min/{1.73_m2} (ref >59)

## 2022-03-14 LAB — LIPID PANEL
Chol/HDL Ratio: 3.4 ratio (ref 0.0–4.4)
Cholesterol, Total: 187 mg/dL (ref 100–199)
HDL: 55 mg/dL (ref >39)
LDL Chol Calc (NIH): 94 mg/dL (ref 0–99)
Triglycerides: 225 mg/dL — ABNORMAL HIGH (ref 0–149)
VLDL Cholesterol Cal: 38 mg/dL (ref 5–40)

## 2022-03-14 LAB — HEPATIC FUNCTION PANEL
ALT: 13 IU/L (ref 0–32)
AST: 15 IU/L (ref 0–40)
Albumin: 4.7 g/dL (ref 3.9–4.9)
Alkaline Phosphatase: 72 IU/L (ref 44–121)
Bilirubin Total: <0.2 mg/dL (ref 0.0–1.2)
Bilirubin, Direct: <0.1 mg/dL (ref 0.00–0.40)
Total Protein: 7.2 g/dL (ref 6.0–8.5)

## 2022-03-14 LAB — HEMOGLOBIN A1C
Est. average glucose Bld gHb Est-mCnc: 111 mg/dL
Hgb A1c MFr Bld: 5.5 % (ref 4.8–5.6)

## 2022-03-15 ENCOUNTER — Other Ambulatory Visit (HOSPITAL_COMMUNITY): Payer: Self-pay

## 2022-03-15 MED ORDER — FLUCONAZOLE 150 MG PO TABS
ORAL_TABLET | ORAL | 5 refills | Status: DC
  Filled 2022-03-15: qty 2, 7d supply, fill #0
  Filled 2022-03-15: qty 2, 14d supply, fill #0
  Filled 2022-03-20: qty 2, 7d supply, fill #1

## 2022-03-15 NOTE — Telephone Encounter (Signed)
May have fluconazole 150 mg 1 p.o. x1 with repeat 1 week if necessary, #2, 5 refills

## 2022-03-20 ENCOUNTER — Other Ambulatory Visit (HOSPITAL_COMMUNITY): Payer: Self-pay

## 2022-03-20 ENCOUNTER — Other Ambulatory Visit: Payer: Self-pay | Admitting: Family Medicine

## 2022-03-21 ENCOUNTER — Other Ambulatory Visit (HOSPITAL_COMMUNITY): Payer: Self-pay

## 2022-03-21 MED ORDER — ALPRAZOLAM 0.5 MG PO TABS
0.2500 mg | ORAL_TABLET | Freq: Two times a day (BID) | ORAL | 2 refills | Status: DC | PRN
  Filled 2022-03-21: qty 30, 15d supply, fill #0
  Filled 2022-04-13: qty 30, 15d supply, fill #1
  Filled 2022-05-24: qty 30, 15d supply, fill #2

## 2022-03-22 ENCOUNTER — Other Ambulatory Visit (HOSPITAL_COMMUNITY): Payer: Self-pay

## 2022-04-11 ENCOUNTER — Telehealth: Payer: No Typology Code available for payment source | Admitting: Nurse Practitioner

## 2022-04-11 ENCOUNTER — Encounter: Payer: Self-pay | Admitting: Family Medicine

## 2022-04-11 ENCOUNTER — Other Ambulatory Visit: Payer: Self-pay | Admitting: Family Medicine

## 2022-04-11 ENCOUNTER — Other Ambulatory Visit (HOSPITAL_COMMUNITY): Payer: Self-pay

## 2022-04-11 DIAGNOSIS — J0110 Acute frontal sinusitis, unspecified: Secondary | ICD-10-CM | POA: Diagnosis not present

## 2022-04-11 MED ORDER — PREDNISONE 10 MG PO TABS
10.0000 mg | ORAL_TABLET | Freq: Two times a day (BID) | ORAL | 0 refills | Status: AC
  Filled 2022-04-11: qty 10, 5d supply, fill #0

## 2022-04-11 NOTE — Progress Notes (Signed)
E-Visit for Sinus Problems  We are sorry that you are not feeling well.  Here is how we plan to help!  Since your symptoms are early we prefer you start with more over the counter medications. Mucinex will help loosen up the mucous and make it easier to come out. Since you have pressure we will also prescribe a low dose prednisone to help with inflammation as well.   Meds ordered this encounter  Medications   predniSONE (DELTASONE) 10 MG tablet    Sig: Take 1 tablet (10 mg total) by mouth 2 (two) times daily with a meal for 5 days.    Dispense:  10 tablet    Refill:  0    Providers prescribe antibiotics to treat infections caused by bacteria. Antibiotics are very powerful in treating bacterial infections when they are used properly. To maintain their effectiveness, they should be used only when necessary. Overuse of antibiotics has resulted in the development of superbugs that are resistant to treatment!    After careful review of your answers, I would not recommend an antibiotic for your condition.  Antibiotics are not effective against viruses and therefore should not be used to treat them. Common examples of infections caused by viruses include colds and flu  Based on what you have shared with me it looks like you have sinusitis.  Sinusitis is inflammation and infection in the sinus cavities of the head.  Based on your presentation I believe you most likely have Acute Viral Sinusitis.This is an infection most likely caused by a virus. There is not specific treatment for viral sinusitis other than to help you with the symptoms until the infection runs its course.  You may use an oral decongestant such as Mucinex D or if you have glaucoma or high blood pressure use plain Mucinex. Saline nasal spray help as well, and continue the Flonase as you have been doing.   Some authorities believe that zinc sprays or the use of Echinacea may shorten the course of your symptoms.  Sinus infections are not as  easily transmitted as other respiratory infection, however we still recommend that you avoid close contact with loved ones, especially the very young and elderly.  Remember to wash your hands thoroughly throughout the day as this is the number one way to prevent the spread of infection!  Home Care: Only take medications as instructed by your medical team. Do not take these medications with alcohol. A steam or ultrasonic humidifier can help congestion.  You can place a towel over your head and breathe in the steam from hot water coming from a faucet. Avoid close contacts especially the very young and the elderly. Cover your mouth when you cough or sneeze. Always remember to wash your hands.  Get Help Right Away If: You develop worsening fever or sinus pain. You develop a severe head ache or visual changes. Your symptoms persist after you have completed your treatment plan.  Make sure you Understand these instructions. Will watch your condition. Will get help right away if you are not doing well or get worse.   Thank you for choosing an e-visit.  Your e-visit answers were reviewed by a board certified advanced clinical practitioner to complete your personal care plan. Depending upon the condition, your plan could have included both over the counter or prescription medications.  Please review your pharmacy choice. Make sure the pharmacy is open so you can pick up prescription now. If there is a problem, you may contact  your provider through [employer] and have the prescription routed to another pharmacy.  Your safety is important to [place]. If you have drug allergies check your prescription carefully.   For the next 24 hours you can use MyChart to ask questions about today's visit, request a non-urgent call back, or ask for a work or school excuse. You will get an email in the next two days asking about your experience. I hope that your e-visit has been valuable and will speed your  recovery.  I spent approximately 5 minutes reviewing the patient's history, current symptoms and coordinating their plan of care today.

## 2022-04-12 ENCOUNTER — Other Ambulatory Visit (HOSPITAL_COMMUNITY): Payer: Self-pay

## 2022-04-12 ENCOUNTER — Other Ambulatory Visit: Payer: Self-pay | Admitting: Family Medicine

## 2022-04-12 MED ORDER — LISDEXAMFETAMINE DIMESYLATE 40 MG PO CAPS
40.0000 mg | ORAL_CAPSULE | ORAL | 0 refills | Status: DC
  Filled 2022-04-12 – 2022-05-15 (×4): qty 30, 30d supply, fill #0

## 2022-04-12 MED ORDER — LISDEXAMFETAMINE DIMESYLATE 40 MG PO CAPS
40.0000 mg | ORAL_CAPSULE | ORAL | 0 refills | Status: DC
  Filled 2022-04-12 – 2022-04-14 (×2): qty 30, 30d supply, fill #0

## 2022-04-13 ENCOUNTER — Other Ambulatory Visit (HOSPITAL_COMMUNITY): Payer: Self-pay | Admitting: Family Medicine

## 2022-04-13 DIAGNOSIS — R112 Nausea with vomiting, unspecified: Secondary | ICD-10-CM

## 2022-04-14 ENCOUNTER — Other Ambulatory Visit (HOSPITAL_COMMUNITY): Payer: Self-pay

## 2022-04-14 ENCOUNTER — Other Ambulatory Visit: Payer: Self-pay | Admitting: Family Medicine

## 2022-04-14 DIAGNOSIS — R112 Nausea with vomiting, unspecified: Secondary | ICD-10-CM

## 2022-04-14 MED ORDER — ONDANSETRON HCL 4 MG PO TABS
4.0000 mg | ORAL_TABLET | Freq: Three times a day (TID) | ORAL | 3 refills | Status: AC | PRN
  Filled 2022-04-14: qty 15, 5d supply, fill #0
  Filled 2022-09-07: qty 15, 5d supply, fill #1
  Filled 2023-03-04: qty 15, 5d supply, fill #2

## 2022-04-18 ENCOUNTER — Other Ambulatory Visit (HOSPITAL_COMMUNITY): Payer: Self-pay

## 2022-05-10 ENCOUNTER — Other Ambulatory Visit (HOSPITAL_COMMUNITY): Payer: Self-pay

## 2022-05-12 ENCOUNTER — Other Ambulatory Visit (HOSPITAL_COMMUNITY): Payer: Self-pay

## 2022-05-15 ENCOUNTER — Other Ambulatory Visit: Payer: Self-pay

## 2022-05-15 ENCOUNTER — Other Ambulatory Visit (HOSPITAL_COMMUNITY): Payer: Self-pay

## 2022-05-18 ENCOUNTER — Other Ambulatory Visit: Payer: Self-pay

## 2022-05-19 ENCOUNTER — Other Ambulatory Visit: Payer: Self-pay

## 2022-05-19 ENCOUNTER — Other Ambulatory Visit (HOSPITAL_COMMUNITY): Payer: Self-pay

## 2022-05-24 ENCOUNTER — Other Ambulatory Visit (HOSPITAL_COMMUNITY): Payer: Self-pay

## 2022-06-13 ENCOUNTER — Other Ambulatory Visit (HOSPITAL_COMMUNITY): Payer: Self-pay

## 2022-06-13 ENCOUNTER — Other Ambulatory Visit: Payer: Self-pay | Admitting: Family Medicine

## 2022-06-15 ENCOUNTER — Other Ambulatory Visit: Payer: Self-pay | Admitting: Family Medicine

## 2022-06-15 ENCOUNTER — Encounter: Payer: Self-pay | Admitting: Family Medicine

## 2022-06-15 ENCOUNTER — Other Ambulatory Visit (HOSPITAL_COMMUNITY): Payer: Self-pay

## 2022-06-15 ENCOUNTER — Telehealth: Payer: Self-pay | Admitting: Family Medicine

## 2022-06-15 MED ORDER — LISDEXAMFETAMINE DIMESYLATE 40 MG PO CAPS
40.0000 mg | ORAL_CAPSULE | ORAL | 0 refills | Status: DC
  Filled 2022-06-15 – 2022-06-16 (×2): qty 9, 9d supply, fill #0
  Filled 2022-06-16: qty 20, 20d supply, fill #0
  Filled 2022-06-16: qty 21, 21d supply, fill #0

## 2022-06-15 NOTE — Telephone Encounter (Signed)
Front Patient is on a controlled medication.  She needs an office visit somewhere by early to mid Adrienne Church was [place] I can [place] any office visit for ADD medicine is 4 months thank you  I will send him an additional 30 days of medicine today thank you

## 2022-06-15 NOTE — Telephone Encounter (Signed)
Nurses-May go ahead and have sertraline 25 mg, 1 daily, #90 with 1 refill I also sent in a prescription for the ADD medicine Please have the front move her appointment into March so I can see her sooner thank you

## 2022-06-16 ENCOUNTER — Other Ambulatory Visit (HOSPITAL_BASED_OUTPATIENT_CLINIC_OR_DEPARTMENT_OTHER): Payer: Self-pay

## 2022-06-16 ENCOUNTER — Other Ambulatory Visit (HOSPITAL_COMMUNITY): Payer: Self-pay

## 2022-06-16 MED ORDER — SERTRALINE HCL 25 MG PO TABS
25.0000 mg | ORAL_TABLET | Freq: Every day | ORAL | 1 refills | Status: DC
  Filled 2022-06-16 (×2): qty 90, 90d supply, fill #0

## 2022-06-23 ENCOUNTER — Other Ambulatory Visit (HOSPITAL_COMMUNITY): Payer: Self-pay

## 2022-07-04 ENCOUNTER — Other Ambulatory Visit: Payer: Self-pay | Admitting: Family Medicine

## 2022-07-06 ENCOUNTER — Encounter: Payer: Self-pay | Admitting: Radiology

## 2022-07-07 ENCOUNTER — Other Ambulatory Visit (HOSPITAL_COMMUNITY): Payer: Self-pay

## 2022-07-07 MED ORDER — ALPRAZOLAM 0.5 MG PO TABS
0.2500 mg | ORAL_TABLET | Freq: Two times a day (BID) | ORAL | 1 refills | Status: DC | PRN
  Filled 2022-07-07: qty 30, 15d supply, fill #0

## 2022-07-12 ENCOUNTER — Other Ambulatory Visit: Payer: Self-pay | Admitting: Family Medicine

## 2022-07-12 ENCOUNTER — Encounter: Payer: Self-pay | Admitting: Family Medicine

## 2022-07-12 MED ORDER — LISDEXAMFETAMINE DIMESYLATE 40 MG PO CAPS
40.0000 mg | ORAL_CAPSULE | ORAL | 0 refills | Status: DC
  Filled 2022-07-12: qty 10, 10d supply, fill #0

## 2022-07-13 ENCOUNTER — Other Ambulatory Visit: Payer: Self-pay

## 2022-07-13 ENCOUNTER — Other Ambulatory Visit (HOSPITAL_COMMUNITY): Payer: Self-pay

## 2022-07-24 ENCOUNTER — Encounter: Payer: Self-pay | Admitting: Family Medicine

## 2022-07-24 ENCOUNTER — Other Ambulatory Visit (HOSPITAL_COMMUNITY): Payer: Self-pay

## 2022-07-24 ENCOUNTER — Ambulatory Visit (INDEPENDENT_AMBULATORY_CARE_PROVIDER_SITE_OTHER): Payer: 59 | Admitting: Family Medicine

## 2022-07-24 VITALS — BP 137/87 | Wt 172.2 lb

## 2022-07-24 DIAGNOSIS — F988 Other specified behavioral and emotional disorders with onset usually occurring in childhood and adolescence: Secondary | ICD-10-CM

## 2022-07-24 MED ORDER — ALPRAZOLAM 0.5 MG PO TABS
0.2500 mg | ORAL_TABLET | Freq: Two times a day (BID) | ORAL | 2 refills | Status: DC | PRN
  Filled 2022-07-24: qty 30, 15d supply, fill #0
  Filled 2022-09-07: qty 30, 15d supply, fill #1
  Filled 2022-10-30: qty 30, 15d supply, fill #2

## 2022-07-24 MED ORDER — LISDEXAMFETAMINE DIMESYLATE 40 MG PO CAPS
40.0000 mg | ORAL_CAPSULE | ORAL | 0 refills | Status: DC
  Filled 2022-07-24 – 2022-08-23 (×3): qty 30, 30d supply, fill #0

## 2022-07-24 MED ORDER — SERTRALINE HCL 25 MG PO TABS
25.0000 mg | ORAL_TABLET | Freq: Every day | ORAL | 1 refills | Status: DC
  Filled 2022-07-24 – 2022-09-07 (×2): qty 90, 90d supply, fill #0

## 2022-07-24 MED ORDER — LISDEXAMFETAMINE DIMESYLATE 40 MG PO CAPS
40.0000 mg | ORAL_CAPSULE | ORAL | 0 refills | Status: DC
  Filled 2022-07-24: qty 30, 30d supply, fill #0

## 2022-07-24 MED ORDER — LISDEXAMFETAMINE DIMESYLATE 40 MG PO CAPS
40.0000 mg | ORAL_CAPSULE | ORAL | 0 refills | Status: DC
  Filled 2022-07-24: qty 30, 30d supply, fill #0
  Filled 2022-09-22: qty 25, 25d supply, fill #0
  Filled 2022-09-22: qty 5, 5d supply, fill #0
  Filled 2022-09-22: qty 30, 30d supply, fill #0

## 2022-07-24 MED ORDER — METFORMIN HCL ER 750 MG PO TB24
750.0000 mg | ORAL_TABLET | Freq: Every day | ORAL | 1 refills | Status: DC
  Filled 2022-07-24 – 2022-09-07 (×2): qty 90, 90d supply, fill #0

## 2022-07-24 NOTE — Progress Notes (Signed)
   Subjective:    Patient ID: Adrienne Church, female    DOB: [birth year], 37 y.o.   MRN: [id number]  HPI  Patient arrives for a follow up on ADHD medication. Patient does take her medicine Does help her stay more focused She states she is more productive with it Does have times where she uses alprazolam at night to help her rest Very busy with work as well as her daughter who is in dance Denies being depressed Does try to eat relatively healthy  No problems or concerns  Review of Systems     Objective:   Physical Exam General-in no acute distress Eyes-no discharge Lungs-respiratory rate normal, CTA CV-no murmurs,RRR Extremities skin warm dry no edema Neuro grossly normal Behavior normal, alert        Assessment & Plan:  Patient uses alprazolam sparingly in the evening time to help her rest has a very hectic lifestyle with busyness at work home as well as her daughter involved in multiple dance activities  Denies being depressed but does find herself on edge at times states that she is taking low-dose sertraline which seems to be helping  Previously another doctor put her on Lamictal that she has taken for years as a mood stabilizer but she states she has never been diagnosed as bipolar and is uncertain if this medicine is really even helping her.  After discussion we will go ahead and recommend that she stop the medicine and give [place] feedback in 3 to 4 weeks if it seems to be helping we can always restart it  ADD.  Drug registry checked.  Prescriptions written. The patient was seen today as part of the visit regarding ADD.  Patient is stable on current regimen.  Appropriate prescriptions prescribed.  Medications were reviewed with the patient as well as compliance. Side effects were checked for. Discussion regarding effectiveness was held. Prescriptions were electronically sent in.  Patient reminded to follow-up in approximately 3 months.   Plans to [facility] law with  drug registry was checked and verified while present with the patient. She will follow-up in approximately 3-1/2 months

## 2022-07-25 ENCOUNTER — Other Ambulatory Visit (HOSPITAL_COMMUNITY): Payer: Self-pay

## 2022-07-25 ENCOUNTER — Encounter: Payer: Self-pay | Admitting: Family Medicine

## 2022-07-25 ENCOUNTER — Other Ambulatory Visit: Payer: Self-pay | Admitting: Family Medicine

## 2022-07-25 MED ORDER — LISDEXAMFETAMINE DIMESYLATE 40 MG PO CAPS
40.0000 mg | ORAL_CAPSULE | ORAL | 0 refills | Status: DC
  Filled 2022-07-25: qty 30, 30d supply, fill #0

## 2022-07-28 ENCOUNTER — Ambulatory Visit: Payer: No Typology Code available for payment source | Admitting: Family Medicine

## 2022-08-05 ENCOUNTER — Ambulatory Visit
Admission: RE | Admit: 2022-08-05 | Discharge: 2022-08-05 | Disposition: A | Discharge status: Home / Self Care | Payer: 59 | Source: Ambulatory Visit | Attending: Nurse Practitioner | Admitting: Nurse Practitioner

## 2022-08-05 VITALS — BP 143/91 | HR 102 | Temp 99.5°F | Resp 18

## 2022-08-05 DIAGNOSIS — Z1152 Encounter for screening for COVID-19: Secondary | ICD-10-CM | POA: Insufficient documentation

## 2022-08-05 DIAGNOSIS — R059 Cough, unspecified: Secondary | ICD-10-CM | POA: Insufficient documentation

## 2022-08-05 MED ORDER — PSEUDOEPH-BROMPHEN-DM 30-2-10 MG/5ML PO SYRP: 5.0000 mL | ORAL_SOLUTION | Freq: Four times a day (QID) | ORAL | 0 refills | Status: DC | PRN

## 2022-08-05 MED ORDER — PREDNISONE 20 MG PO TABS: 40.0000 mg | ORAL_TABLET | Freq: Every day | ORAL | 0 refills | Status: AC

## 2022-08-05 NOTE — ED Provider Notes (Signed)
[facility]    CSN: [id number] Arrival date & time: 08/05/22  1119      History   Chief Complaint Chief Complaint  Patient presents with   Cough    Entered by patient    HPI Adrienne Church is a 37 y.o. female.   The history is provided by the patient.   The patient presents for complaints of cough that is been present for the past 2 days.  She states that with the cough, she has pain in her chest and in her back.  She describes the pain in her chest as "sharp" and states the pain is located in the middle of her chest.  She denies fever, chills, headache, ear pain, sore throat, wheezing, shortness of breath, difficulty breathing, or GI symptoms.  Patient states that she has been taking Flonase, Allegra, and using Delsym.  She states that she also has an albuterol inhaler that she was using since she was diagnosed with COVID.  Patient denies any obvious sick contacts.  Past Medical History:  Diagnosis Date   Anxiety    MVP (mitral valve prolapse)    Renal disorder    kidney stones    Patient Active Problem List   Diagnosis Date Noted   Acanthosis nigricans 11/09/2021   Mitral valve prolapse 01/07/2019   GAD (generalized anxiety disorder) 09/06/2017   Attention deficit disorder 08/13/2017   Infertility counseling 11/22/2015   Anxiety 11/01/2015   Multiple allergies 11/01/2015    Past Surgical History:  Procedure Laterality Date   BREAST REDUCTION SURGERY     TENDON REPAIR      OB History     Gravida  1   Para  1   Term  1   Preterm      AB      Living  1      SAB      IAB      Ectopic      Multiple      Live Births  1            Home Medications    Prior to Admission medications   Medication Sig Start Date End Date Taking? Authorizing Provider  brompheniramine-pseudoephedrine-DM 30-2-10 MG/5ML syrup Take 5 mLs by mouth 4 (four) times daily as needed. 08/05/22  Yes Harleyquinn Gasser-Warren, Alda Lea, NP  predniSONE (DELTASONE) 20 MG  tablet Take 2 tablets (40 mg total) by mouth daily with breakfast for 5 days. 08/05/22 08/10/22 Yes Renie Stelmach-Warren, Alda Lea, NP  albuterol (VENTOLIN HFA) 108 (90 Base) MCG/ACT inhaler Inhale 2 puffs into the lungs every 6 (six) hours as needed for wheezing or shortness of breath. 03/13/22   Kathyrn Drown, MD  ALPRAZolam Duanne Moron) 0.5 MG tablet Take 1/2 to 1 tablet by mouth 2 times daily as needed. CAUTION: DROWSINESS 07/24/22   Kathyrn Drown, MD  fluocinonide cream (LIDEX) 0.05 % Apply to affected area as needed. 07/26/21   [employer], Trenton Gammon, FNP  ketoconazole (NIZORAL) 2 % cream Apply topically 2 times daily as needed for irritation. 09/29/21   Kathyrn Drown, MD  labetalol (NORMODYNE) 100 MG tablet Take 100 mg by mouth 2 (two) times daily.    [provider]  lamoTRIgine (LAMICTAL) 100 MG tablet Take 1 tablet (100 mg total) by mouth at bedtime. 03/13/22   Kathyrn Drown, MD  lisdexamfetamine (VYVANSE) 40 MG capsule Take 1 capsule (40 mg total) by mouth every morning. (fill 08/23/22) 07/24/22   Sallee Lange  A, MD  lisdexamfetamine (VYVANSE) 40 MG capsule Take 1 capsule (40 mg total) by mouth every morning. (fill 09/22/22) 07/24/22   Kathyrn Drown, MD  lisdexamfetamine (VYVANSE) 40 MG capsule Take 1 capsule (40 mg total) by mouth every morning. 07/25/22   Kathyrn Drown, MD  metFORMIN (GLUCOPHAGE-XR) 750 MG 24 hr tablet Take 1 tablet (750 mg total) by mouth daily with breakfast. 07/24/22   Kathyrn Drown, MD  nystatin cream (MYCOSTATIN) Apply to affected areas 2 times a day 11/23/21     ondansetron (ZOFRAN) 4 MG tablet Take 1 tablet (4 mg total) by mouth every 8 (eight) hours as needed for nausea or vomiting. 04/14/22   Kathyrn Drown, MD  sertraline (ZOLOFT) 25 MG tablet Take 1 tablet (25 mg total) by mouth daily. 07/24/22   Kathyrn Drown, MD  valACYclovir (VALTREX) 1000 MG tablet Take 2 tablets (2,000 mg total) by mouth now and take 2 tablets in 12 hours 03/28/21   Kathyrn Drown, MD     Family History Family History  Problem Relation Age of Onset   Kidney disease Mother    Mental illness Mother    Thyroid disease Mother    Mitral valve prolapse Father     Social History Social History   Tobacco Use   Smoking status: Never   Smokeless tobacco: Never  Substance Use Topics   Alcohol use: Yes    Comment: occ   Drug use: No     Allergies   Azithromycin   Review of Systems Review of Systems Per HPI  Physical Exam Triage Vital Signs ED Triage Vitals  Enc Vitals Group     BP 08/05/22 1133 (!) 143/91     Pulse Rate 08/05/22 1133 (!) 102     Resp 08/05/22 1133 18     Temp 08/05/22 1133 99.5 F (37.5 C)     Temp Source 08/05/22 1133 Oral     SpO2 08/05/22 1133 95 %     Weight --      Height --      Head Circumference --      Peak Flow --      Pain Score 08/05/22 1134 4     Pain Loc --      Pain Edu? --      Excl. in [place]? --    No data found.  Updated Vital Signs BP (!) 143/91 (BP Location: Right Arm)   Pulse (!) 102   Temp 99.5 F (37.5 C) (Oral)   Resp 18   LMP 07/19/2022 (Approximate)   SpO2 95%   Visual Acuity Right Eye Distance:   Left Eye Distance:   Bilateral Distance:    Right Eye Near:   Left Eye Near:    Bilateral Near:     Physical Exam Vitals and nursing note reviewed.  Constitutional:      General: She is not in acute distress.    Appearance: Normal appearance.  HENT:     Head: Normocephalic.     Right Ear: Tympanic membrane, ear canal and external ear normal.     Left Ear: Tympanic membrane, ear canal and external ear normal.     Nose: Nose normal.     Mouth/Throat:     Mouth: Mucous membranes are moist.     Pharynx: Posterior oropharyngeal erythema present.     Comments: Cobblestoning present on posterior oropharynx Eyes:     Extraocular Movements: Extraocular movements intact.     Conjunctiva/sclera:  Conjunctivae normal.     Pupils: Pupils are equal, round, and reactive to light.  Cardiovascular:      Rate and Rhythm: Normal rate and regular rhythm.     Pulses: Normal pulses.     Heart sounds: Normal heart sounds.  Pulmonary:     Effort: Pulmonary effort is normal. No respiratory distress.     Breath sounds: Normal breath sounds. No stridor. No wheezing, rhonchi or rales.  Abdominal:     General: Bowel sounds are normal.     Palpations: Abdomen is soft.     Tenderness: There is no abdominal tenderness.  Musculoskeletal:     Cervical back: Normal range of motion.  Lymphadenopathy:     Cervical: No cervical adenopathy.  Skin:    General: Skin is warm and dry.  Neurological:     General: No focal deficit present.     Mental Status: She is alert and oriented to person, place, and time.  Psychiatric:        Mood and Affect: Mood normal.        Behavior: Behavior normal.      UC Treatments / Results  Labs (all labs ordered are listed, but only abnormal results are displayed) Labs Reviewed  SARS CORONAVIRUS 2 (TAT 6-24 HRS)    EKG   Radiology No results found.  Procedures Procedures (including critical care time)  Medications Ordered in UC Medications - No data to display  Initial Impression / Assessment and Plan / UC Course  I have reviewed the triage vital signs and the nursing notes.  Pertinent labs & imaging results that were available during my care of the patient were reviewed by me and considered in my medical decision making (see chart for details).  The patient is well-appearing, she is in no acute distress, she is mildly tachycardic and hypertensive, but vital signs are otherwise stable.  Lung sounds are clear throughout.  Chest pain is reproducible, and present with the cough only.  Do not suspect cardiac involvement at this time.  Symptoms appear to be consistent with viral etiology.  Will treat patient with prednisone 40 mg for the next 5 days to help with bronchial inflammation.  For her cough, Bromfed-DM was prescribed.  Patient was advised to continue  use of her albuterol inhaler as needed.  Supportive care recommendations were provided and discussed with the patient to include over-the-counter analgesics for pain or discomfort, increasing fluids, and use of a humidifier in her bedroom at nighttime during sleep.  Patient was given indications of when follow-up may be necessary.  Patient is in agreement with this plan of care and verbalizes understanding.  All questions were answered and patient stable for discharge.  Final Clinical Impressions(s) / UC Diagnoses   Final diagnoses:  Encounter for screening for COVID-19  Cough, unspecified type     Discharge Instructions      COVID test is pending.  You will be contacted if the pending test result is positive.  As discussed, you are able to receive Paxlovid if your COVID test is positive. Take medication as prescribed.  Continue your current allergy medications. Increase fluids and allow for plenty of rest. May take over-the-counter Tylenol or ibuprofen as needed for pain, fever, or general discomfort. Recommend using a humidifier in your bedroom at nighttime during sleep and sleeping elevated on pillows while cough symptoms persist. If you develop new symptoms such as fever, chills, wheezing, shortness of breath, difficulty breathing, please follow-up in this clinic or  with your primary care physician for further evaluation. Follow-up as needed.     ED Prescriptions     Medication Sig Dispense Auth. Provider   predniSONE (DELTASONE) 20 MG tablet Take 2 tablets (40 mg total) by mouth daily with breakfast for 5 days. 10 tablet Amalio Loe-Warren, Alda Lea, NP   brompheniramine-pseudoephedrine-DM 30-2-10 MG/5ML syrup Take 5 mLs by mouth 4 (four) times daily as needed. 140 mL Yarnell Arvidson-Warren, Alda Lea, NP      PDMP not reviewed this encounter.   Tish Men, NP 08/05/22 1216

## 2022-08-05 NOTE — ED Triage Notes (Signed)
Cough x 2 days.  Upper chest and back pain.  Pain worse when coughing.  Has been taking delsym, Flonase and allegra.

## 2022-08-05 NOTE — Discharge Instructions (Addendum)
COVID test is pending.  You will be contacted if the pending test result is positive.  As discussed, you are able to receive Paxlovid if your COVID test is positive. Take medication as prescribed.  Continue your current allergy medications. Increase fluids and allow for plenty of rest. May take over-the-counter Tylenol or ibuprofen as needed for pain, fever, or general discomfort. Recommend using a humidifier in your bedroom at nighttime during sleep and sleeping elevated on pillows while cough symptoms persist. If you develop new symptoms such as fever, chills, wheezing, shortness of breath, difficulty breathing, please follow-up in this clinic or with your primary care physician for further evaluation. Follow-up as needed.

## 2022-08-06 LAB — SARS CORONAVIRUS 2 (TAT 6-24 HRS): SARS Coronavirus 2: NEGATIVE

## 2022-08-09 ENCOUNTER — Encounter: Payer: Self-pay | Admitting: Family Medicine

## 2022-08-10 ENCOUNTER — Other Ambulatory Visit (HOSPITAL_COMMUNITY): Payer: Self-pay

## 2022-08-10 ENCOUNTER — Telehealth: Payer: 59 | Admitting: Family Medicine

## 2022-08-10 DIAGNOSIS — J0110 Acute frontal sinusitis, unspecified: Secondary | ICD-10-CM | POA: Diagnosis not present

## 2022-08-10 MED ORDER — PROMETHAZINE-DM 6.25-15 MG/5ML PO SYRP
5.0000 mL | ORAL_SOLUTION | Freq: Four times a day (QID) | ORAL | 0 refills | Status: DC | PRN
Start: 2022-08-10 — End: 2022-11-22
  Filled 2022-08-10: qty 118, 6d supply, fill #0

## 2022-08-10 MED ORDER — AMOXICILLIN-POT CLAVULANATE 875-125 MG PO TABS
1.0000 | ORAL_TABLET | Freq: Two times a day (BID) | ORAL | 0 refills | Status: AC
Start: 2022-08-10 — End: 2022-08-17
  Filled 2022-08-10: qty 14, 7d supply, fill #0

## 2022-08-10 NOTE — Telephone Encounter (Signed)
Nurses Hard to know for certain what is going on More than likely symptoms are an extension of the virus that she was diagnosed with Typically viral illnesses can take up to 7 to 10 days to turn the corner If not doing better by Monday I would recommend recheck Sooner if any other issues or worsening issues

## 2022-08-10 NOTE — Progress Notes (Signed)
Virtual Visit Consent   [name], you are scheduled for a virtual visit with a [facility] provider today. Just as with appointments in the office, your consent must be obtained to participate. Your consent will be active for this visit and any virtual visit you may have with one of our providers in the next 365 days. If you have a MyChart account, a copy of this consent can be sent to you electronically.  As this is a virtual visit, video technology does not allow for your provider to perform a traditional examination. This may limit your provider's ability to fully assess your condition. If your provider identifies any concerns that need to be evaluated in person or the need to arrange testing (such as labs, EKG, etc.), we will make arrangements to do so. Although advances in technology are sophisticated, we cannot ensure that it will always work on either your end or our end. If the connection with a video visit is poor, the visit may have to be switched to a telephone visit. With either a video or telephone visit, we are not always able to ensure that we have a secure connection.  By engaging in this virtual visit, you consent to the provision of healthcare and authorize for your insurance to be billed (if applicable) for the services provided during this visit. Depending on your insurance coverage, you may receive a charge related to this service.  I need to obtain your verbal consent now. Are you willing to proceed with your visit today? [name] has provided verbal consent on 08/10/2022 for a virtual visit (video or telephone). [name], NP  Date: 08/10/2022 12:29 PM  Virtual Visit via Video Note   I, [name], connected with  Adrienne Church  ([id number], 03/04/1986) on 08/10/22 at 12:30 PM EDT by a video-enabled telemedicine application and verified that I am speaking with the correct person using two identifiers.  Location: Patient: Virtual Visit Location Patient:  Home Provider: Virtual Visit Location Provider: Home Office   I discussed the limitations of evaluation and management by telemedicine and the availability of in person appointments. The patient expressed understanding and agreed to proceed.    History of Present Illness: Adrienne Church is a 37 y.o. who identifies as a female who was assigned female at birth, and is being seen today for congestion and cough.  Onset was 08/03/2022 for cough with back pain- treated at [facility] 08/05/22 with pred 40 x5 days and Bromfed DM- limited if any improvement.  Associated symptoms are now has sinus congestion, sore throat, ear muffled, sinus pressure, mild headaches Modifying factors are zytrec, inhaler, above meds, and other OTC's Denies chest pain, shortness of breath, fevers, chills  Exposure to sick contacts- unknown COVID test: negative  Problems:  Patient Active Problem List   Diagnosis Date Noted   Acanthosis nigricans 11/09/2021   Mitral valve prolapse 01/07/2019   GAD (generalized anxiety disorder) 09/06/2017   Attention deficit disorder 08/13/2017   Infertility counseling 11/22/2015   Anxiety 11/01/2015   Multiple allergies 11/01/2015    Allergies:  Allergies  Allergen Reactions   Azithromycin Other (See Comments)    other   Medications:  Current Outpatient Medications:    albuterol (VENTOLIN HFA) 108 (90 Base) MCG/ACT inhaler, Inhale 2 puffs into the lungs every 6 (six) hours as needed for wheezing or shortness of breath., Disp: 6.7 g, Rfl: 0   ALPRAZolam (XANAX) 0.5 MG tablet, Take 1/2 to 1 tablet  by mouth 2 times daily as needed. CAUTION: DROWSINESS, Disp: 30 tablet, Rfl: 2   brompheniramine-pseudoephedrine-DM 30-2-10 MG/5ML syrup, Take 5 mLs by mouth 4 (four) times daily as needed., Disp: 140 mL, Rfl: 0   fluocinonide cream (LIDEX) 0.05 %, Apply to affected area as needed., Disp: 60 g, Rfl: 3   ketoconazole (NIZORAL) 2 % cream, Apply topically 2 times daily as needed for irritation.,  Disp: 30 g, Rfl: 0   labetalol (NORMODYNE) 100 MG tablet, Take 100 mg by mouth 2 (two) times daily., Disp: , Rfl:    lamoTRIgine (LAMICTAL) 100 MG tablet, Take 1 tablet (100 mg total) by mouth at bedtime., Disp: 90 tablet, Rfl: 1   lisdexamfetamine (VYVANSE) 40 MG capsule, Take 1 capsule (40 mg total) by mouth every morning. (fill 08/23/22), Disp: 30 capsule, Rfl: 0   lisdexamfetamine (VYVANSE) 40 MG capsule, Take 1 capsule (40 mg total) by mouth every morning. (fill 09/22/22), Disp: 30 capsule, Rfl: 0   lisdexamfetamine (VYVANSE) 40 MG capsule, Take 1 capsule (40 mg total) by mouth every morning., Disp: 30 capsule, Rfl: 0   metFORMIN (GLUCOPHAGE-XR) 750 MG 24 hr tablet, Take 1 tablet (750 mg total) by mouth daily with breakfast., Disp: 90 tablet, Rfl: 1   nystatin cream (MYCOSTATIN), Apply to affected areas 2 times a day, Disp: 60 g, Rfl: 0   ondansetron (ZOFRAN) 4 MG tablet, Take 1 tablet (4 mg total) by mouth every 8 (eight) hours as needed for nausea or vomiting., Disp: 15 tablet, Rfl: 3   predniSONE (DELTASONE) 20 MG tablet, Take 2 tablets (40 mg total) by mouth daily with breakfast for 5 days., Disp: 10 tablet, Rfl: 0   sertraline (ZOLOFT) 25 MG tablet, Take 1 tablet (25 mg total) by mouth daily., Disp: 90 tablet, Rfl: 1   valACYclovir (VALTREX) 1000 MG tablet, Take 2 tablets (2,000 mg total) by mouth now and take 2 tablets in 12 hours, Disp: 4 tablet, Rfl: 5  Observations/Objective: Patient is well-developed, well-nourished in no acute distress.  Resting comfortably  at home.  Head is normocephalic, atraumatic.  No labored breathing.  Speech is clear and coherent with logical content.  Patient is alert and oriented at baseline.  Cough present  Assessment and Plan: 1. Acute non-recurrent frontal sinusitis  - amoxicillin-clavulanate (AUGMENTIN) 875-125 MG tablet; Take 1 tablet by mouth 2 (two) times daily for 7 days.  Dispense: 14 tablet; Refill: 0 - promethazine-dextromethorphan  (PROMETHAZINE-DM) 6.25-15 MG/5ML syrup; Take 5 mLs by mouth 4 (four) times daily as needed for cough.  Dispense: 118 mL; Refill: 0   -given worsening symptoms, most likely uncontrolled allergies that led to start of bronchitis and now sinus infection -treatment as per above -hydration and voice rest -continue allergy medication as well  Reviewed side effects, risks and benefits of medication.    Patient acknowledged agreement and understanding of the plan.   Past Medical, Surgical, Social History, Allergies, and Medications have been Reviewed.     Follow Up Instructions: I discussed the assessment and treatment plan with the patient. The patient was provided an opportunity to ask questions and all were answered. The patient agreed with the plan and demonstrated an understanding of the instructions.  A copy of instructions were sent to the patient via MyChart unless otherwise noted below.    The patient was advised to call back or seek an in-person evaluation if the symptoms worsen or if the condition fails to improve as anticipated.  Time:  I spent 10 minutes  with the patient via telehealth technology discussing the above problems/concerns.    [name], NP

## 2022-08-10 NOTE — Patient Instructions (Signed)
Adrienne Church, thank you for joining [name], NP for today's virtual visit.  While this provider is not your primary care provider (PCP), if your PCP is located in our provider database this encounter information will be shared with them immediately following your visit.   [facility] account gives you access to today's visit and all your visits, tests, and labs performed at [facility] " click here if you don't have a [facility] account or go to [email or web address]  Consent: (Patient) Adrienne Church provided verbal consent for this virtual visit at the beginning of the encounter.  Current Medications:  Current Outpatient Medications:    amoxicillin-clavulanate (AUGMENTIN) 875-125 MG tablet, Take 1 tablet by mouth 2 (two) times daily for 7 days., Disp: 14 tablet, Rfl: 0   promethazine-dextromethorphan (PROMETHAZINE-DM) 6.25-15 MG/5ML syrup, Take 5 mLs by mouth 4 (four) times daily as needed for cough., Disp: 118 mL, Rfl: 0   albuterol (VENTOLIN HFA) 108 (90 Base) MCG/ACT inhaler, Inhale 2 puffs into the lungs every 6 (six) hours as needed for wheezing or shortness of breath., Disp: 6.7 g, Rfl: 0   ALPRAZolam (XANAX) 0.5 MG tablet, Take 1/2 to 1 tablet by mouth 2 times daily as needed. CAUTION: DROWSINESS, Disp: 30 tablet, Rfl: 2   fluocinonide cream (LIDEX) 0.05 %, Apply to affected area as needed., Disp: 60 g, Rfl: 3   ketoconazole (NIZORAL) 2 % cream, Apply topically 2 times daily as needed for irritation., Disp: 30 g, Rfl: 0   labetalol (NORMODYNE) 100 MG tablet, Take 100 mg by mouth 2 (two) times daily., Disp: , Rfl:    lamoTRIgine (LAMICTAL) 100 MG tablet, Take 1 tablet (100 mg total) by mouth at bedtime., Disp: 90 tablet, Rfl: 1   lisdexamfetamine (VYVANSE) 40 MG capsule, Take 1 capsule (40 mg total) by mouth every morning. (fill 08/23/22), Disp: 30 capsule, Rfl: 0   lisdexamfetamine (VYVANSE) 40 MG capsule, Take 1 capsule (40 mg total) by mouth  every morning. (fill 09/22/22), Disp: 30 capsule, Rfl: 0   lisdexamfetamine (VYVANSE) 40 MG capsule, Take 1 capsule (40 mg total) by mouth every morning., Disp: 30 capsule, Rfl: 0   metFORMIN (GLUCOPHAGE-XR) 750 MG 24 hr tablet, Take 1 tablet (750 mg total) by mouth daily with breakfast., Disp: 90 tablet, Rfl: 1   nystatin cream (MYCOSTATIN), Apply to affected areas 2 times a day, Disp: 60 g, Rfl: 0   ondansetron (ZOFRAN) 4 MG tablet, Take 1 tablet (4 mg total) by mouth every 8 (eight) hours as needed for nausea or vomiting., Disp: 15 tablet, Rfl: 3   predniSONE (DELTASONE) 20 MG tablet, Take 2 tablets (40 mg total) by mouth daily with breakfast for 5 days., Disp: 10 tablet, Rfl: 0   sertraline (ZOLOFT) 25 MG tablet, Take 1 tablet (25 mg total) by mouth daily., Disp: 90 tablet, Rfl: 1   valACYclovir (VALTREX) 1000 MG tablet, Take 2 tablets (2,000 mg total) by mouth now and take 2 tablets in 12 hours, Disp: 4 tablet, Rfl: 5   Medications ordered in this encounter:  Meds ordered this encounter  Medications   amoxicillin-clavulanate (AUGMENTIN) 875-125 MG tablet    Sig: Take 1 tablet by mouth 2 (two) times daily for 7 days.    Dispense:  14 tablet    Refill:  0    Order Specific Question:   Supervising Provider    Answer:   Chase Picket [id number]   promethazine-dextromethorphan (PROMETHAZINE-DM) 6.25-15 MG/5ML syrup  Sig: Take 5 mLs by mouth 4 (four) times daily as needed for cough.    Dispense:  118 mL    Refill:  0    Order Specific Question:   Supervising Provider    Answer:   Chase Picket [id number]     *If you need refills on other medications prior to your next appointment, please contact your pharmacy*  Follow-Up: Call back or seek an in-person evaluation if the symptoms worsen or if the condition fails to improve as anticipated.  [place] [facility] [phone]  Other Instructions -Take meds as prescribed -Rest -Use a cool mist humidifier especially during  the winter months when heat dries out the air. - Use saline nose sprays frequently to help soothe nasal passages and promote drainage. -Saline irrigations of the nose can be very helpful if done frequently.             * 4X daily for 1 week*             * Use of a nettie pot can be helpful with this.  *Follow directions with this* *Boiled or distilled water only -stay hydrated by drinking plenty of fluids  If you do not improve you will need a follow up visit in person.                   If you have been instructed to have an in-person evaluation today at a local Urgent Care facility, please use the link below. It will take you to a list of all of our available [facility], including address, phone number and hours of operation. Please do not delay care.  [facility]  If you or a family member do not have a primary care provider, use the link below to schedule a visit and establish care. When you choose a [facility] primary care physician or advanced practice provider, you gain a long-term partner in health. Find a Primary Care Provider  Learn more about [facility]'s in-office and virtual care options: [facility]

## 2022-08-11 ENCOUNTER — Ambulatory Visit: Payer: No Typology Code available for payment source | Admitting: Family Medicine

## 2022-08-22 ENCOUNTER — Other Ambulatory Visit (HOSPITAL_COMMUNITY): Payer: Self-pay

## 2022-08-23 ENCOUNTER — Other Ambulatory Visit (HOSPITAL_COMMUNITY): Payer: Self-pay

## 2022-09-07 ENCOUNTER — Other Ambulatory Visit: Payer: Self-pay

## 2022-09-07 ENCOUNTER — Other Ambulatory Visit (HOSPITAL_COMMUNITY): Payer: Self-pay

## 2022-09-08 ENCOUNTER — Other Ambulatory Visit (HOSPITAL_COMMUNITY): Payer: Self-pay

## 2022-09-20 ENCOUNTER — Other Ambulatory Visit (HOSPITAL_COMMUNITY): Payer: Self-pay

## 2022-09-22 ENCOUNTER — Other Ambulatory Visit (HOSPITAL_COMMUNITY): Payer: Self-pay

## 2022-09-22 ENCOUNTER — Other Ambulatory Visit: Payer: Self-pay

## 2022-09-25 ENCOUNTER — Other Ambulatory Visit (HOSPITAL_COMMUNITY): Payer: Self-pay

## 2022-10-01 ENCOUNTER — Other Ambulatory Visit: Payer: Self-pay | Admitting: Family Medicine

## 2022-10-03 ENCOUNTER — Other Ambulatory Visit (HOSPITAL_COMMUNITY): Payer: Self-pay

## 2022-10-03 ENCOUNTER — Other Ambulatory Visit: Payer: Self-pay | Admitting: Family Medicine

## 2022-10-03 ENCOUNTER — Encounter: Payer: Self-pay | Admitting: Family Medicine

## 2022-10-03 MED ORDER — VALACYCLOVIR HCL 1 G PO TABS
2000.0000 mg | ORAL_TABLET | Freq: Two times a day (BID) | ORAL | 12 refills | Status: DC
  Filled 2022-10-03 (×3): qty 4, 1d supply, fill #0
  Filled 2022-10-30: qty 4, 1d supply, fill #1
  Filled 2022-11-07: qty 4, 1d supply, fill #2
  Filled 2022-12-03: qty 4, 1d supply, fill #3

## 2022-10-03 MED ORDER — FLUOCINONIDE 0.05 % EX CREA
TOPICAL_CREAM | CUTANEOUS | 3 refills | Status: AC | PRN
  Filled 2022-10-03 (×2): qty 60, 30d supply, fill #0
  Filled 2022-10-03: qty 60, 15d supply, fill #0

## 2022-10-04 ENCOUNTER — Other Ambulatory Visit (HOSPITAL_COMMUNITY): Payer: Self-pay

## 2022-10-09 ENCOUNTER — Other Ambulatory Visit (HOSPITAL_BASED_OUTPATIENT_CLINIC_OR_DEPARTMENT_OTHER): Payer: Self-pay

## 2022-10-18 ENCOUNTER — Other Ambulatory Visit (HOSPITAL_COMMUNITY): Payer: Self-pay

## 2022-10-18 ENCOUNTER — Other Ambulatory Visit: Payer: Self-pay | Admitting: Family Medicine

## 2022-10-19 ENCOUNTER — Other Ambulatory Visit: Payer: Self-pay | Admitting: Family Medicine

## 2022-10-19 NOTE — Telephone Encounter (Signed)
Refill on    lisdexamfetamine (VYVANSE) 40 MG capsule  Send to [facility] long pharmacy has appointment on 7/17

## 2022-10-20 ENCOUNTER — Other Ambulatory Visit (HOSPITAL_COMMUNITY): Payer: Self-pay

## 2022-10-20 ENCOUNTER — Encounter: Payer: Self-pay | Admitting: Family Medicine

## 2022-10-20 MED ORDER — LISDEXAMFETAMINE DIMESYLATE 40 MG PO CAPS
40.0000 mg | ORAL_CAPSULE | ORAL | 0 refills | Status: DC
  Filled 2022-10-20: qty 20, 20d supply, fill #0

## 2022-10-20 NOTE — Telephone Encounter (Signed)
Prescription pended to provider

## 2022-10-30 ENCOUNTER — Other Ambulatory Visit: Payer: Self-pay

## 2022-10-30 ENCOUNTER — Other Ambulatory Visit (HOSPITAL_COMMUNITY): Payer: Self-pay

## 2022-11-01 ENCOUNTER — Other Ambulatory Visit (HOSPITAL_COMMUNITY): Payer: Self-pay

## 2022-11-07 ENCOUNTER — Other Ambulatory Visit: Payer: Self-pay | Admitting: Family Medicine

## 2022-11-07 ENCOUNTER — Encounter: Payer: Self-pay | Admitting: Family Medicine

## 2022-11-07 ENCOUNTER — Other Ambulatory Visit (HOSPITAL_COMMUNITY): Payer: Self-pay

## 2022-11-07 MED ORDER — LISDEXAMFETAMINE DIMESYLATE 40 MG PO CAPS
40.0000 mg | ORAL_CAPSULE | ORAL | 0 refills | Status: DC
  Filled 2022-11-07 – 2022-11-08 (×2): qty 12, 12d supply, fill #0

## 2022-11-08 ENCOUNTER — Other Ambulatory Visit (HOSPITAL_COMMUNITY): Payer: Self-pay

## 2022-11-15 ENCOUNTER — Other Ambulatory Visit: Payer: Self-pay | Admitting: Oncology

## 2022-11-15 DIAGNOSIS — Z006 Encounter for examination for normal comparison and control in clinical research program: Secondary | ICD-10-CM

## 2022-11-22 ENCOUNTER — Ambulatory Visit (INDEPENDENT_AMBULATORY_CARE_PROVIDER_SITE_OTHER): Payer: 59 | Admitting: Family Medicine

## 2022-11-22 ENCOUNTER — Other Ambulatory Visit (HOSPITAL_COMMUNITY): Payer: Self-pay

## 2022-11-22 ENCOUNTER — Other Ambulatory Visit: Payer: Self-pay

## 2022-11-22 VITALS — BP 125/80 | HR 70 | Wt 176.8 lb

## 2022-11-22 DIAGNOSIS — F988 Other specified behavioral and emotional disorders with onset usually occurring in childhood and adolescence: Secondary | ICD-10-CM | POA: Diagnosis not present

## 2022-11-22 MED ORDER — METFORMIN HCL ER 750 MG PO TB24
750.0000 mg | ORAL_TABLET | Freq: Every day | ORAL | 1 refills | Status: DC
  Filled 2022-11-22: qty 90, 90d supply, fill #0

## 2022-11-22 MED ORDER — LISDEXAMFETAMINE DIMESYLATE 30 MG PO CAPS
30.0000 mg | ORAL_CAPSULE | Freq: Every day | ORAL | 0 refills | Status: DC
  Filled 2022-11-22 – 2022-12-21 (×2): qty 30, 30d supply, fill #0

## 2022-11-22 MED ORDER — SERTRALINE HCL 25 MG PO TABS
25.0000 mg | ORAL_TABLET | Freq: Every day | ORAL | 1 refills | Status: DC
  Filled 2022-11-22 – 2022-12-21 (×3): qty 90, 90d supply, fill #0
  Filled 2023-03-19: qty 90, 90d supply, fill #1

## 2022-11-22 MED ORDER — LISDEXAMFETAMINE DIMESYLATE 30 MG PO CAPS
30.0000 mg | ORAL_CAPSULE | Freq: Every day | ORAL | 0 refills | Status: DC
  Filled 2022-11-22 – 2023-01-18 (×2): qty 30, 30d supply, fill #0
  Filled ????-??-??: fill #0

## 2022-11-22 MED ORDER — ALPRAZOLAM 0.5 MG PO TABS
0.2500 mg | ORAL_TABLET | Freq: Two times a day (BID) | ORAL | 2 refills | Status: DC | PRN
  Filled 2022-11-22: qty 30, 15d supply, fill #0
  Filled 2023-01-24: qty 30, 15d supply, fill #1
  Filled 2023-03-04: qty 30, 15d supply, fill #2

## 2022-11-22 MED ORDER — LISDEXAMFETAMINE DIMESYLATE 30 MG PO CAPS
30.0000 mg | ORAL_CAPSULE | Freq: Every day | ORAL | 0 refills | Status: DC
  Filled 2022-11-22 (×2): qty 30, 30d supply, fill #0

## 2022-11-22 NOTE — Progress Notes (Signed)
   Subjective:    Patient ID: Adrienne Church, female    DOB: Mar 26, 1986, 37 y.o.   MRN: [id number]  HPI This patient has adult ADD. Takes medication responsibly. Medication does help the patient focus in be more functional. Patient relates that they are or not abusing the medication or misusing the medication. The patient understands that if they're having any negative side effects such as elevated high blood pressure severe headaches they would need stop the medication follow-up immediately. They also understand that the prescriptions are to last for 3 months then the patient will need to follow-up before having further prescriptions.  Patient compliance yes  Does medication help patient function /attention better  yes  Side effects  no    Review of Systems     Objective:   Physical Exam  General-in no acute distress Eyes-no discharge Lungs-respiratory rate normal, CTA CV-no murmurs,RRR Extremities skin warm dry no edema Neuro grossly normal Behavior normal, alert       Assessment & Plan:   She relates her environment is less hectic she do not feel she needs as much medicine we will reduce it to 30 she will let us know if any problems  The patient was seen today as part of the visit regarding ADD.  Patient is stable on current regimen.  Appropriate prescriptions prescribed.  Medications were reviewed with the patient as well as compliance. Side effects were checked for. Discussion regarding effectiveness was held. Prescriptions were electronically sent in.  Patient reminded to follow-up in approximately 3 months.   Plans to [facility] law with drug registry was checked and verified while present with the patient. Follow-up within 4 months

## 2022-11-23 ENCOUNTER — Ambulatory Visit: Payer: 59 | Admitting: Family Medicine

## 2022-12-04 ENCOUNTER — Other Ambulatory Visit: Payer: Self-pay | Admitting: Family Medicine

## 2022-12-04 ENCOUNTER — Other Ambulatory Visit (HOSPITAL_COMMUNITY): Payer: Self-pay

## 2022-12-04 MED ORDER — VALACYCLOVIR HCL 1 G PO TABS
2000.0000 mg | ORAL_TABLET | Freq: Two times a day (BID) | ORAL | 12 refills | Status: DC
  Filled 2022-12-04: qty 4, 1d supply, fill #0

## 2022-12-04 MED ORDER — VALACYCLOVIR HCL 1 G PO TABS
ORAL_TABLET | ORAL | 12 refills | Status: DC
  Filled 2022-12-04 – 2022-12-05 (×2): qty 16, 4d supply, fill #0
  Filled 2022-12-21: qty 16, 30d supply, fill #0

## 2022-12-05 ENCOUNTER — Other Ambulatory Visit (HOSPITAL_COMMUNITY): Payer: Self-pay

## 2022-12-08 ENCOUNTER — Other Ambulatory Visit (HOSPITAL_COMMUNITY): Payer: Self-pay

## 2022-12-08 ENCOUNTER — Other Ambulatory Visit: Payer: Self-pay

## 2022-12-13 ENCOUNTER — Other Ambulatory Visit (HOSPITAL_COMMUNITY): Payer: Self-pay

## 2022-12-16 ENCOUNTER — Other Ambulatory Visit (HOSPITAL_COMMUNITY): Payer: Self-pay

## 2022-12-21 ENCOUNTER — Other Ambulatory Visit (HOSPITAL_COMMUNITY): Payer: Self-pay

## 2023-01-17 ENCOUNTER — Other Ambulatory Visit (HOSPITAL_COMMUNITY): Payer: Self-pay

## 2023-01-18 ENCOUNTER — Encounter: Payer: Self-pay | Admitting: Family Medicine

## 2023-01-18 ENCOUNTER — Other Ambulatory Visit (HOSPITAL_COMMUNITY): Payer: Self-pay

## 2023-01-19 ENCOUNTER — Other Ambulatory Visit (HOSPITAL_COMMUNITY): Payer: Self-pay

## 2023-01-19 ENCOUNTER — Other Ambulatory Visit: Payer: Self-pay

## 2023-01-19 ENCOUNTER — Other Ambulatory Visit: Payer: Self-pay | Admitting: Family Medicine

## 2023-01-19 MED ORDER — LISDEXAMFETAMINE DIMESYLATE 30 MG PO CAPS
30.0000 mg | ORAL_CAPSULE | Freq: Every day | ORAL | 0 refills | Status: DC
  Filled 2023-01-19: qty 30, 30d supply, fill #0

## 2023-01-19 MED ORDER — LISDEXAMFETAMINE DIMESYLATE 30 MG PO CAPS
30.0000 mg | ORAL_CAPSULE | Freq: Every day | ORAL | 0 refills | Status: DC
  Filled 2023-01-19 (×2): qty 30, 30d supply, fill #0

## 2023-01-22 ENCOUNTER — Telehealth: Payer: 59 | Admitting: Physician Assistant

## 2023-01-22 DIAGNOSIS — E282 Polycystic ovarian syndrome: Secondary | ICD-10-CM | POA: Diagnosis not present

## 2023-01-22 DIAGNOSIS — L551 Sunburn of second degree: Secondary | ICD-10-CM

## 2023-01-22 DIAGNOSIS — Z319 Encounter for procreative management, unspecified: Secondary | ICD-10-CM | POA: Diagnosis not present

## 2023-01-22 DIAGNOSIS — Z3181 Encounter for male factor infertility in female patient: Secondary | ICD-10-CM | POA: Diagnosis not present

## 2023-01-22 DIAGNOSIS — N970 Female infertility associated with anovulation: Secondary | ICD-10-CM | POA: Diagnosis not present

## 2023-01-22 MED ORDER — SILVER SULFADIAZINE 1 % EX CREA
1.0000 | TOPICAL_CREAM | Freq: Every day | CUTANEOUS | 0 refills | Status: AC
Start: 2023-01-22 — End: ?

## 2023-01-22 NOTE — Progress Notes (Signed)
E-Visit for Sunburn  We are sorry that you are not feeling well.  Here is how we plan to help!  Based on what you have shared with me, I'd like to share with you a treatment plan for sunburn.   Most sunburn is a first degree burn that turns the skin pink or red.  It can be painful to touch.  If you stayed in the sun for a prolonged period this might have progressed to a second degree burn with blistering!  Usually the pain and swelling starts after about 4 hours, peaks at 24 hours and begins to improve after 48 hours or about 2 days.  REMEMBER prolonged exposure to the sun increases your risk of skin cancer so use sunscreen before you go outside!  We will give you more information about sunscreen use later in your care plan.  Your sunburn can be managed by self-care at home.  Please use the following care guide to manage your sunburn.  If you symptoms worsen, you have other questions or concerns, or you develop any of the warnings signs listed in your care plan you will need to seek a face to face visit with a provider without waiting!  Home Care Advice for Treating Mild Sunburn:  Take Ibuprofen (Advil, Motrin) for pain relief as soon as possible.  The adult dosage is up to 600 mg every 6 hours.  Starting within 6 hours of sun exposure may greatly reduce your discomfort.  If you cannot take Ibuprofen you may use Acetaminophen instead. Do not take Ibuprofen if you have stomach problems, kidney disease or are pregnant. Do not take Ibuprofen if you have been told by your doctor or pharmacist to avoid this class of drugs. Do not take Acetaminophen if you have liver disease. Read the package warnings on any medication that you take!  2.  Use a steroid cream on the affected skin.  If you apply an over the counter steroid cream as soon as possible and repeat it three times a day it may reduce the pain and swelling.  Until you get the steroid cream you may start with a moistening cream or aloe  gel.  3. For second or third degree sunburn with painful blistering, you can use an over the counter product Burn Jel Plus Pain Relieving Gel. Apply in a thick even layer over the affected area not more than 3 to 4 times daily If you need to cover the area to protect it from friction of clothing, you can use Moist Burn Pads such as Hydrogel Burn Pads which are available over the counter.  4.  Apply cool compresses to the burned areas several times a day.  5.  Avoid soap on the sunburned areas.  6.  Drink plenty of water.  It is easy to get dehydrated from prolong time in the sun      Outdoors.  7.  For any broken blisters: Trim off the dead skin with fine scissors.  It is wise to clean the scissor with alcohol before use. Apply antibiotic ointments to the blister.  Apply twice a day for three days.  There are triple antibiotic ointments with topical pain relievers available at stores. Caution:leave intact blisters alone.  They are protecting the skin and will allow it to heal.  8.  Taking Vitamin C orally may reduce sun damage to your skin.  Follow the      Instructions on the bottle.  The recommended adult dosage is 2 grams.  I have prescribed Silvadene cream to apply to affected areas that open and drain to prevent infection.  What to Expect:  Pain usually stops after 2 or 3 days. Skin flaking and peeling usually occurs for 3-7 days after a sunburn.  Call your provider if:  You feel very weak or have difficulty standing. Blister develops on your face. You become sensitive to light because of eye pain. Your skin looks infected (red streaks, puss or worsening tenderness after 48 hours. You feel you should be seen.  Preventing Sunburns:  Apply 20-30 SPF sunscreen to your skin before going into the sun. Reapply every 2-4 hours or after sweating or swimming. Sunscreens protect from sunburns but do not completely prevent skin damage.  Wynelle Link exposure still increases your risk of  premature aging and skin cancers.  Thank you for choosing an e-visit.  Your e-visit answers were reviewed by a board certified advanced clinical practitioner to complete your personal care plan. Depending upon the condition, your plan could have included both over the counter or prescription medications.  Please review your pharmacy choice. Make sure the pharmacy is open so you can pick up prescription now. If there is a problem, you may contact your provider through [employer] and have the prescription routed to another pharmacy.  Your safety is important to [place]. If you have drug allergies check your prescription carefully.   For the next 24 hours you can use MyChart to ask questions about today's visit, request a non-urgent call back, or ask for a work or school excuse. You will get an email in the next two days asking about your experience. I hope that your e-visit has been valuable and will speed your recovery.  I have spent 5 minutes in review of e-visit questionnaire, review and updating patient chart, medical decision making and response to patient.   Margaretann Loveless, PA-C

## 2023-01-23 DIAGNOSIS — N979 Female infertility, unspecified: Secondary | ICD-10-CM | POA: Diagnosis not present

## 2023-01-23 DIAGNOSIS — E282 Polycystic ovarian syndrome: Secondary | ICD-10-CM | POA: Diagnosis not present

## 2023-01-24 ENCOUNTER — Other Ambulatory Visit: Payer: Self-pay

## 2023-01-24 DIAGNOSIS — Z3181 Encounter for male factor infertility in female patient: Secondary | ICD-10-CM | POA: Diagnosis not present

## 2023-01-24 DIAGNOSIS — E282 Polycystic ovarian syndrome: Secondary | ICD-10-CM | POA: Diagnosis not present

## 2023-01-24 DIAGNOSIS — Z319 Encounter for procreative management, unspecified: Secondary | ICD-10-CM | POA: Diagnosis not present

## 2023-01-24 DIAGNOSIS — N970 Female infertility associated with anovulation: Secondary | ICD-10-CM | POA: Diagnosis not present

## 2023-01-24 DIAGNOSIS — E559 Vitamin D deficiency, unspecified: Secondary | ICD-10-CM | POA: Diagnosis not present

## 2023-01-25 ENCOUNTER — Other Ambulatory Visit: Payer: Self-pay

## 2023-01-25 ENCOUNTER — Encounter: Payer: Self-pay | Admitting: Emergency Medicine

## 2023-01-25 ENCOUNTER — Ambulatory Visit
Admission: EM | Admit: 2023-01-25 | Discharge: 2023-01-25 | Disposition: A | Discharge status: Home / Self Care | Payer: 59 | Attending: Nurse Practitioner | Admitting: Nurse Practitioner

## 2023-01-25 DIAGNOSIS — T887XXA Unspecified adverse effect of drug or medicament, initial encounter: Secondary | ICD-10-CM | POA: Diagnosis not present

## 2023-01-25 DIAGNOSIS — R112 Nausea with vomiting, unspecified: Secondary | ICD-10-CM

## 2023-01-25 DIAGNOSIS — R197 Diarrhea, unspecified: Secondary | ICD-10-CM | POA: Diagnosis not present

## 2023-01-25 HISTORY — DX: Attention-deficit hyperactivity disorder, unspecified type: F90.9

## 2023-01-25 HISTORY — DX: Polycystic ovarian syndrome: E28.2

## 2023-01-25 MED ORDER — PROMETHAZINE HCL 12.5 MG PO TABS: 12.5000 mg | ORAL_TABLET | Freq: Four times a day (QID) | ORAL | 0 refills | Status: AC | PRN

## 2023-01-25 NOTE — ED Notes (Signed)
Patient given water to drink for PO challenge.

## 2023-01-25 NOTE — ED Triage Notes (Signed)
Headache, vomiting and diarrhea that started this afternoon.  States she felt lightheaded earlier today.  States her metformin was increased recently and thinks this is causing her symptoms.

## 2023-01-25 NOTE — ED Provider Notes (Signed)
[facility]    CSN: [id number] Arrival date & time: 01/25/23  1740      History   Chief Complaint No chief complaint on file.   HPI Adrienne Church is a 37 y.o. female.   The history is provided by the patient.   Patient presents for complaints of nausea, vomiting, diarrhea and headache that has been present for the past 2 days.  Patient reports that her metformin was recently increased by her fertility doctor.  She states since the medication increase, she has been experiencing symptoms.  She states that she has vomited approximately 3 times today, and reports 1-2 episodes of diarrhea.  She states that she has continued to experience some dizziness and lightheadedness, but does not have symptoms as long as she sits still.  Patient denies fever, chills, chest pain, visual changes, light sensitivity, sound sensitivity, abdominal pain, or constipation.  Patient reports that she did take 8 mg of Zofran around 5:30 PM this evening.  Past Medical History:  Diagnosis Date   ADHD    Anxiety    MVP (mitral valve prolapse)    PCOS (polycystic ovarian syndrome)    Renal disorder    kidney stones    Patient Active Problem List   Diagnosis Date Noted   Acanthosis nigricans 11/09/2021   Mitral valve prolapse 01/07/2019   GAD (generalized anxiety disorder) 09/06/2017   Attention deficit disorder 08/13/2017   Infertility counseling 11/22/2015   Anxiety 11/01/2015   Multiple allergies 11/01/2015    Past Surgical History:  Procedure Laterality Date   BREAST REDUCTION SURGERY     TENDON REPAIR      OB History     Gravida  1   Para  1   Term  1   Preterm      AB      Living  1      SAB      IAB      Ectopic      Multiple      Live Births  1            Home Medications    Prior to Admission medications   Medication Sig Start Date End Date Taking? Authorizing Provider  promethazine (PHENERGAN) 12.5 MG tablet Take 1 tablet (12.5 mg total) by  mouth every 6 (six) hours as needed for nausea or vomiting. 01/25/23  Yes Esme Durkin-Warren, Sadie Haber, NP  albuterol (VENTOLIN HFA) 108 (90 Base) MCG/ACT inhaler Inhale 2 puffs into the lungs every 6 (six) hours as needed for wheezing or shortness of breath. 03/13/22   Babs Sciara, MD  ALPRAZolam Prudy Feeler) 0.5 MG tablet Take 1/2 to 1 tablet by mouth 2 times daily as needed. CAUTION: DROWSINESS 11/22/22   Babs Sciara, MD  fluocinonide cream (LIDEX) 0.05 % Apply to affected area as needed. 10/03/22   Babs Sciara, MD  ketoconazole (NIZORAL) 2 % cream Apply topically 2 times daily as needed for irritation. 09/29/21   Babs Sciara, MD  lisdexamfetamine (VYVANSE) 30 MG capsule Take 1 capsule (30 mg total) by mouth daily. 01/19/23   Babs Sciara, MD  metFORMIN (GLUCOPHAGE-XR) 750 MG 24 hr tablet Take 1 tablet (750 mg total) by mouth daily with breakfast. 11/22/22   Babs Sciara, MD  nystatin cream (MYCOSTATIN) Apply to affected areas 2 times a day 11/23/21     ondansetron (ZOFRAN) 4 MG tablet Take 1 tablet (4 mg total) by mouth every 8 (eight) hours  as needed for nausea or vomiting. 04/14/22   Babs Sciara, MD  sertraline (ZOLOFT) 25 MG tablet Take 1 tablet (25 mg total) by mouth daily. 11/22/22   Babs Sciara, MD  silver sulfADIAZINE (SILVADENE) 1 % cream Apply 1 Application topically daily. 01/22/23   Margaretann Loveless, PA-C  valACYclovir (VALTREX) 1000 MG tablet Take 2 tablets now and may repeat 2 tablets in 12 hours when needed for cold sores 12/04/22   Babs Sciara, MD    Family History Family History  Problem Relation Age of Onset   Kidney disease Mother    Mental illness Mother    Thyroid disease Mother    Mitral valve prolapse Father     Social History Social History   Tobacco Use   Smoking status: Never   Smokeless tobacco: Never  Substance Use Topics   Alcohol use: Yes    Comment: occ   Drug use: No     Allergies   Azithromycin   Review of Systems Review of  Systems Per HPI  Physical Exam Triage Vital Signs ED Triage Vitals  Encounter Vitals Group     BP 01/25/23 1920 (!) 152/92     Systolic BP Percentile --      Diastolic BP Percentile --      Pulse Rate 01/25/23 1920 88     Resp 01/25/23 1920 18     Temp 01/25/23 1920 98.2 F (36.8 C)     Temp Source 01/25/23 1920 Oral     SpO2 01/25/23 1920 98 %     Weight --      Height --      Head Circumference --      Peak Flow --      Pain Score 01/25/23 1921 7     Pain Loc --      Pain Education --      Exclude from Growth Chart --    Orthostatic VS for the past 24 hrs:  BP- Lying Pulse- Lying BP- Sitting Pulse- Sitting BP- Standing at 0 minutes Pulse- Standing at 0 minutes  01/25/23 1936 154/90 84 150/90 84 (!) 151/91 81    Updated Vital Signs BP (!) 152/92 (BP Location: Right Arm)   Pulse 88   Temp 98.2 F (36.8 C) (Oral)   Resp 18   LMP 01/20/2023 (Exact Date)   SpO2 98%   Visual Acuity Right Eye Distance:   Left Eye Distance:   Bilateral Distance:    Right Eye Near:   Left Eye Near:    Bilateral Near:     Physical Exam Vitals and nursing note reviewed.  Constitutional:      General: She is not in acute distress.    Appearance: Normal appearance.  HENT:     Head: Normocephalic.  Eyes:     Extraocular Movements: Extraocular movements intact.     Conjunctiva/sclera: Conjunctivae normal.     Pupils: Pupils are equal, round, and reactive to light.  Cardiovascular:     Rate and Rhythm: Normal rate and regular rhythm.     Pulses: Normal pulses.     Heart sounds: Normal heart sounds.  Pulmonary:     Effort: Pulmonary effort is normal. No respiratory distress.     Breath sounds: Normal breath sounds. No stridor. No wheezing, rhonchi or rales.  Abdominal:     General: Bowel sounds are normal.     Palpations: Abdomen is soft.     Tenderness: There is no abdominal tenderness.  Comments: P.o. challenge administered.  Patient is sipping ice water.  She reports that  she continues to experience nausea, but no vomiting at this time.  Musculoskeletal:     Cervical back: Normal range of motion.  Lymphadenopathy:     Cervical: No cervical adenopathy.  Skin:    General: Skin is warm and dry.  Neurological:     General: No focal deficit present.     Mental Status: She is alert and oriented to person, place, and time.     GCS: GCS eye subscore is 4. GCS verbal subscore is 5. GCS motor subscore is 6.     Cranial Nerves: Cranial nerves 2-12 are intact.     Motor: Motor function is intact.     Coordination: Coordination is intact.     Gait: Gait is intact.  Psychiatric:        Mood and Affect: Mood normal.        Behavior: Behavior normal.      UC Treatments / Results  Labs (all labs ordered are listed, but only abnormal results are displayed) Labs Reviewed - No data to display  EKG   Radiology No results found.  Procedures Procedures (including critical care time)  Medications Ordered in UC Medications - No data to display  Initial Impression / Assessment and Plan / UC Course  I have reviewed the triage vital signs and the nursing notes.  Pertinent labs & imaging results that were available during my care of the patient were reviewed by me and considered in my medical decision making (see chart for details).  The patient is well-appearing, she is in no acute distress, vital signs are stable.  The symptoms appear to be consistent with the increased dosage of the metformin as symptoms started around the same time.  Patient has been taking Zofran without relief.  Phenergan 12.5 mg tablets were provided for severe nausea and vomiting.  Supportive care recommendations were provided and discussed with the patient to include increasing fluids, a clear liquid diet, and allowing for plenty of rest.  Patient was given strict ER follow-up precautions, and also advised to stop the metformin.  Patient is in agreement with this plan of care and verbalizes  understanding.  All questions were answered.  Patient stable for discharge.  Final Clinical Impressions(s) / UC Diagnoses   Final diagnoses:  Nausea and vomiting, unspecified vomiting type  Diarrhea, unspecified type  Medication side effect     Discharge Instructions      Stop the metformin. Take medication as prescribed. As discussed, recommend ice chips, then progressing to ice water, and then other fluids as tolerated. When you are able to eat, begin a clear liquid diet this includes soup, broth, Jell-O, popsicles, Sprite, or ginger ale.  If you are able to tolerate that, advance to a soft diet to include soft meats and vegetables, and once you are able to tolerate that, you can advance to regular diet. As discussed, if you begin experiencing nausea, vomiting, or diarrhea and are unable to keep any foods or liquids down, please go to the emergency department immediately for further evaluation. Follow-up as needed.      ED Prescriptions     Medication Sig Dispense Auth. Provider   promethazine (PHENERGAN) 12.5 MG tablet Take 1 tablet (12.5 mg total) by mouth every 6 (six) hours as needed for nausea or vomiting. 20 tablet Barbarita Hutmacher-Warren, Sadie Haber, NP      PDMP not reviewed this encounter.   Bryker Fletchall-Warren,  Sadie Haber, NP 01/25/23 2013

## 2023-01-25 NOTE — Discharge Instructions (Signed)
Stop the metformin. Take medication as prescribed. As discussed, recommend ice chips, then progressing to ice water, and then other fluids as tolerated. When you are able to eat, begin a clear liquid diet this includes soup, broth, Jell-O, popsicles, Sprite, or ginger ale.  If you are able to tolerate that, advance to a soft diet to include soft meats and vegetables, and once you are able to tolerate that, you can advance to regular diet. As discussed, if you begin experiencing nausea, vomiting, or diarrhea and are unable to keep any foods or liquids down, please go to the emergency department immediately for further evaluation. Follow-up as needed.

## 2023-02-01 DIAGNOSIS — Z3183 Encounter for assisted reproductive fertility procedure cycle: Secondary | ICD-10-CM | POA: Diagnosis not present

## 2023-02-02 DIAGNOSIS — Z3141 Encounter for fertility testing: Secondary | ICD-10-CM | POA: Diagnosis not present

## 2023-02-06 ENCOUNTER — Other Ambulatory Visit (HOSPITAL_COMMUNITY): Payer: Self-pay

## 2023-02-06 DIAGNOSIS — Z113 Encounter for screening for infections with a predominantly sexual mode of transmission: Secondary | ICD-10-CM | POA: Diagnosis not present

## 2023-02-06 DIAGNOSIS — Z319 Encounter for procreative management, unspecified: Secondary | ICD-10-CM | POA: Diagnosis not present

## 2023-02-06 MED ORDER — ESTRADIOL 0.1 MG/24HR TD PTTW
1.0000 | MEDICATED_PATCH | TRANSDERMAL | 3 refills | Status: AC
Start: 2023-02-06 — End: ?
  Filled 2023-02-06: qty 8, 30d supply, fill #0
  Filled 2023-03-04 – 2023-03-19 (×2): qty 8, 30d supply, fill #1
  Filled 2023-05-03: qty 8, 30d supply, fill #2
  Filled 2023-05-21: qty 8, 28d supply, fill #3

## 2023-02-07 ENCOUNTER — Other Ambulatory Visit (HOSPITAL_COMMUNITY): Payer: Self-pay

## 2023-02-19 ENCOUNTER — Other Ambulatory Visit (HOSPITAL_COMMUNITY): Payer: Self-pay

## 2023-02-19 ENCOUNTER — Encounter: Payer: Self-pay | Admitting: Family Medicine

## 2023-02-19 ENCOUNTER — Other Ambulatory Visit: Payer: Self-pay | Admitting: Nurse Practitioner

## 2023-02-19 ENCOUNTER — Other Ambulatory Visit: Payer: Self-pay | Admitting: Family Medicine

## 2023-02-19 MED ORDER — LISDEXAMFETAMINE DIMESYLATE 30 MG PO CAPS
30.0000 mg | ORAL_CAPSULE | Freq: Every day | ORAL | 0 refills | Status: DC
  Filled 2023-02-19: qty 30, 30d supply, fill #0

## 2023-03-05 ENCOUNTER — Other Ambulatory Visit: Payer: Self-pay

## 2023-03-05 ENCOUNTER — Other Ambulatory Visit (HOSPITAL_COMMUNITY): Payer: Self-pay

## 2023-03-05 DIAGNOSIS — Z31430 Encounter of female for testing for genetic disease carrier status for procreative management: Secondary | ICD-10-CM | POA: Diagnosis not present

## 2023-03-05 DIAGNOSIS — E282 Polycystic ovarian syndrome: Secondary | ICD-10-CM | POA: Diagnosis not present

## 2023-03-05 DIAGNOSIS — Z3183 Encounter for assisted reproductive fertility procedure cycle: Secondary | ICD-10-CM | POA: Diagnosis not present

## 2023-03-05 DIAGNOSIS — Z113 Encounter for screening for infections with a predominantly sexual mode of transmission: Secondary | ICD-10-CM | POA: Diagnosis not present

## 2023-03-05 DIAGNOSIS — N979 Female infertility, unspecified: Secondary | ICD-10-CM | POA: Diagnosis not present

## 2023-03-06 ENCOUNTER — Other Ambulatory Visit (HOSPITAL_COMMUNITY): Payer: Self-pay

## 2023-03-08 DIAGNOSIS — N711 Chronic inflammatory disease of uterus: Secondary | ICD-10-CM | POA: Diagnosis not present

## 2023-03-08 DIAGNOSIS — Z3141 Encounter for fertility testing: Secondary | ICD-10-CM | POA: Diagnosis not present

## 2023-03-19 ENCOUNTER — Encounter: Payer: Self-pay | Admitting: Family Medicine

## 2023-03-19 ENCOUNTER — Other Ambulatory Visit (HOSPITAL_COMMUNITY): Payer: Self-pay

## 2023-03-19 ENCOUNTER — Other Ambulatory Visit: Payer: Self-pay | Admitting: Family Medicine

## 2023-03-19 MED ORDER — LISDEXAMFETAMINE DIMESYLATE 30 MG PO CAPS
30.0000 mg | ORAL_CAPSULE | Freq: Every day | ORAL | 0 refills | Status: DC
Start: 2023-03-19 — End: 2023-03-30
  Filled 2023-03-19 – 2023-03-21 (×4): qty 15, 15d supply, fill #0
  Filled ????-??-??: fill #0

## 2023-03-19 MED ORDER — NORETHINDRONE ACETATE 5 MG PO TABS
5.0000 mg | ORAL_TABLET | Freq: Every day | ORAL | 3 refills | Status: AC
  Filled 2023-03-19: qty 14, 14d supply, fill #0

## 2023-03-20 ENCOUNTER — Other Ambulatory Visit (HOSPITAL_COMMUNITY): Payer: Self-pay

## 2023-03-20 ENCOUNTER — Encounter (HOSPITAL_COMMUNITY): Payer: Self-pay

## 2023-03-21 ENCOUNTER — Other Ambulatory Visit (HOSPITAL_COMMUNITY): Payer: Self-pay

## 2023-03-27 ENCOUNTER — Ambulatory Visit: Payer: 59 | Admitting: Family Medicine

## 2023-03-30 ENCOUNTER — Ambulatory Visit (INDEPENDENT_AMBULATORY_CARE_PROVIDER_SITE_OTHER): Payer: 59 | Admitting: Family Medicine

## 2023-03-30 ENCOUNTER — Encounter: Payer: Self-pay | Admitting: Family Medicine

## 2023-03-30 VITALS — BP 114/78

## 2023-03-30 DIAGNOSIS — F988 Other specified behavioral and emotional disorders with onset usually occurring in childhood and adolescence: Secondary | ICD-10-CM

## 2023-03-30 DIAGNOSIS — F411 Generalized anxiety disorder: Secondary | ICD-10-CM | POA: Diagnosis not present

## 2023-03-30 MED ORDER — METFORMIN HCL ER 750 MG PO TB24
750.0000 mg | ORAL_TABLET | Freq: Every day | ORAL | 1 refills | Status: DC
  Filled 2023-03-30: qty 90, 90d supply, fill #0

## 2023-03-30 MED ORDER — LISDEXAMFETAMINE DIMESYLATE 30 MG PO CAPS
30.0000 mg | ORAL_CAPSULE | Freq: Every day | ORAL | 0 refills | Status: AC
  Filled 2023-03-30: qty 15, 15d supply, fill #0

## 2023-03-30 MED ORDER — LISDEXAMFETAMINE DIMESYLATE 30 MG PO CAPS
30.0000 mg | ORAL_CAPSULE | Freq: Every day | ORAL | 0 refills | Status: DC
  Filled 2023-03-30: qty 30, 30d supply, fill #0

## 2023-03-30 MED ORDER — ALPRAZOLAM 0.5 MG PO TABS
0.2500 mg | ORAL_TABLET | Freq: Two times a day (BID) | ORAL | 5 refills | Status: AC | PRN
  Filled 2023-03-30: qty 30, 15d supply, fill #0

## 2023-03-30 MED ORDER — SERTRALINE HCL 25 MG PO TABS
25.0000 mg | ORAL_TABLET | Freq: Every day | ORAL | 1 refills | Status: AC
  Filled 2023-03-30: qty 90, 90d supply, fill #0

## 2023-03-30 MED ORDER — LISDEXAMFETAMINE DIMESYLATE 30 MG PO CAPS
30.0000 mg | ORAL_CAPSULE | Freq: Every day | ORAL | 0 refills | Status: AC
  Filled 2023-03-30: qty 30, 30d supply, fill #0

## 2023-03-30 NOTE — Progress Notes (Signed)
   Subjective:    Patient ID: Adrienne Church, female    DOB: 14-Mar-1986, 37 y.o.   MRN: [id number]  HPI This patient has adult ADD. Takes medication responsibly. Medication does help the patient focus in be more functional. Patient relates that they are or not abusing the medication or misusing the medication. The patient understands that if they're having any negative side effects such as elevated high blood pressure severe headaches they would need stop the medication follow-up immediately. They also understand that the prescriptions are to last for 3 months then the patient will need to follow-up before having further prescriptions.  Patient compliance good compliance  Does medication help patient function /attention better takes medicine every day  Side effects states it helps her focus no side effects    Review of Systems     Objective:   Physical Exam  General-in no acute distress Eyes-no discharge Lungs-respiratory rate normal, CTA CV-no murmurs,RRR Extremities skin warm dry no edema Neuro grossly normal Behavior normal, alert       Assessment & Plan:  The patient was seen today as part of the visit regarding ADD.  Patient is stable on current regimen.  Appropriate prescriptions prescribed.  Medications were reviewed with the patient as well as compliance. Side effects were checked for. Discussion regarding effectiveness was held. Prescriptions were electronically sent in.  Patient reminded to follow-up in approximately 3 months.   Plans to [facility] law with drug registry was checked and verified while present with the patient.  Patient is patient uses metformin due to PCOS and controlling sugar  She uses Xanax at nighttime when necessary to help her rest  She was instructed to make sure that she communicates with her fertility doctor if she does become pregnant she needs to seek his advice regarding which medicine she needs to stop She needs to keep [place] informed  of this she understands this

## 2023-03-31 ENCOUNTER — Other Ambulatory Visit (HOSPITAL_COMMUNITY): Payer: Self-pay

## 2023-04-01 NOTE — Telephone Encounter (Signed)
Nurses Please order CMP as well as A1c Diagnosis PCOS, high risk med  Also may forward the following message to [facility] Vyvanse can be stopped abruptly but if you are more interested in tapering it does come in a 20 mg that you can utilize 7 days ahead of stopping or Literally 7 days prior to stopping you could split the Vyvanse capsule (obviously not an easy task) and utilize just a half a capsule's worth of medicine for 7 days But once again if you just wanted to abruptly stop you could  As for alprazolam I would recommend starting now-I assume you are using 1 tablet each evening to help rest?  If that is the case I would recommend reducing it currently to three fourths of a tablet for the next 7 days then 1/2 tablet each evening for 7 days then 1/4 tablet each evening for 7 days then you will officially be stopped at that point  Feel free to give feedback or questions thanks-Dr. Lorin Picket

## 2023-04-02 ENCOUNTER — Other Ambulatory Visit: Payer: Self-pay

## 2023-04-02 ENCOUNTER — Other Ambulatory Visit (HOSPITAL_COMMUNITY): Payer: Self-pay

## 2023-04-02 DIAGNOSIS — E282 Polycystic ovarian syndrome: Secondary | ICD-10-CM

## 2023-04-02 DIAGNOSIS — Z79899 Other long term (current) drug therapy: Secondary | ICD-10-CM

## 2023-04-04 ENCOUNTER — Other Ambulatory Visit: Payer: Self-pay

## 2023-04-04 ENCOUNTER — Other Ambulatory Visit: Payer: Self-pay | Admitting: Family Medicine

## 2023-04-04 ENCOUNTER — Other Ambulatory Visit (HOSPITAL_COMMUNITY): Payer: Self-pay

## 2023-04-04 MED ORDER — LISDEXAMFETAMINE DIMESYLATE 30 MG PO CAPS
30.0000 mg | ORAL_CAPSULE | Freq: Every day | ORAL | 0 refills | Status: AC
Start: 2023-04-04 — End: ?
  Filled 2023-04-04 (×2): qty 30, 30d supply, fill #0

## 2023-04-09 ENCOUNTER — Other Ambulatory Visit (HOSPITAL_COMMUNITY): Payer: Self-pay

## 2023-04-13 ENCOUNTER — Other Ambulatory Visit (HOSPITAL_COMMUNITY): Payer: Self-pay

## 2023-04-27 ENCOUNTER — Other Ambulatory Visit (HOSPITAL_COMMUNITY): Payer: Self-pay

## 2023-04-27 MED ORDER — ESTRADIOL 2 MG PO TABS
2.0000 mg | ORAL_TABLET | Freq: Two times a day (BID) | ORAL | 4 refills | Status: AC
  Filled 2023-04-27: qty 60, 30d supply, fill #0

## 2023-05-01 DIAGNOSIS — Z3200 Encounter for pregnancy test, result unknown: Secondary | ICD-10-CM | POA: Diagnosis not present

## 2023-05-03 DIAGNOSIS — Z3200 Encounter for pregnancy test, result unknown: Secondary | ICD-10-CM | POA: Diagnosis not present

## 2023-05-07 ENCOUNTER — Other Ambulatory Visit (HOSPITAL_COMMUNITY): Payer: Self-pay

## 2023-05-09 NOTE — L&D Delivery Note (Addendum)
 Bulging membranes at introitus The sac was grasped and delivered intact with fetus and placenta together.Minimal bleeding and vagina without tears and uterus was firm with minimal bleeding.  EBL 20cc  The sac was taken to the nearby room where it was opened and fetus noted to have nuchal cord x 3.  Cord cut at umbilicus and 3 inches of cord placed in genetic specimen cup with preservative and sealed per their directions No obvious abnormality of the fetus or placenta noted and all the tissue was in the placenta dish and consistent with a 18 week demise. The fetus and placenta was shown to [place] and her questions were answered. EBL 20cc The family does not wish burial/cremation and wants the remains disposed of per hospital routine.

## 2023-05-10 ENCOUNTER — Other Ambulatory Visit (HOSPITAL_COMMUNITY): Payer: Self-pay

## 2023-05-10 DIAGNOSIS — Z3200 Encounter for pregnancy test, result unknown: Secondary | ICD-10-CM | POA: Diagnosis not present

## 2023-05-10 MED ORDER — PROGESTERONE 50 MG/ML IM OIL
50.0000 mg | TOPICAL_OIL | Freq: Every day | INTRAMUSCULAR | 4 refills | Status: AC
  Filled 2023-05-10: qty 30, 30d supply, fill #0
  Filled 2024-01-10: qty 20, 20d supply, fill #1
  Filled 2024-01-10 – 2024-01-11 (×3): qty 30, 30d supply, fill #1
  Filled 2024-02-05: qty 30, 30d supply, fill #2

## 2023-05-11 ENCOUNTER — Other Ambulatory Visit (HOSPITAL_COMMUNITY): Payer: Self-pay

## 2023-05-17 DIAGNOSIS — Z3200 Encounter for pregnancy test, result unknown: Secondary | ICD-10-CM | POA: Diagnosis not present

## 2023-05-21 ENCOUNTER — Other Ambulatory Visit (HOSPITAL_COMMUNITY): Payer: Self-pay

## 2023-05-24 ENCOUNTER — Other Ambulatory Visit (HOSPITAL_COMMUNITY): Payer: Self-pay

## 2023-05-28 ENCOUNTER — Other Ambulatory Visit (HOSPITAL_COMMUNITY): Payer: Self-pay

## 2023-05-28 DIAGNOSIS — O0900 Supervision of pregnancy with history of infertility, unspecified trimester: Secondary | ICD-10-CM | POA: Diagnosis not present

## 2023-05-28 MED ORDER — PROGESTERONE 200 MG PO CAPS
200.0000 mg | ORAL_CAPSULE | Freq: Every day | ORAL | 0 refills | Status: AC
  Filled 2023-05-28: qty 14, 14d supply, fill #0

## 2023-07-02 DIAGNOSIS — Z3A12 12 weeks gestation of pregnancy: Secondary | ICD-10-CM | POA: Diagnosis not present

## 2023-07-02 DIAGNOSIS — Z3685 Encounter for antenatal screening for Streptococcus B: Secondary | ICD-10-CM | POA: Diagnosis not present

## 2023-07-02 DIAGNOSIS — Z3481 Encounter for supervision of other normal pregnancy, first trimester: Secondary | ICD-10-CM | POA: Diagnosis not present

## 2023-07-02 LAB — OB RESULTS CONSOLE ANTIBODY SCREEN: Antibody Screen: NEGATIVE

## 2023-07-02 LAB — OB RESULTS CONSOLE GC/CHLAMYDIA
Chlamydia: NEGATIVE
Neisseria Gonorrhea: NEGATIVE

## 2023-07-02 LAB — OB RESULTS CONSOLE RPR: RPR: NONREACTIVE

## 2023-07-02 LAB — OB RESULTS CONSOLE HEPATITIS B SURFACE ANTIGEN: Hepatitis B Surface Ag: NEGATIVE

## 2023-07-02 LAB — OB RESULTS CONSOLE HIV ANTIBODY (ROUTINE TESTING): HIV: NONREACTIVE

## 2023-07-02 LAB — OB RESULTS CONSOLE ABO/RH: RH Type: POSITIVE

## 2023-07-02 LAB — OB RESULTS CONSOLE RUBELLA ANTIBODY, IGM: Rubella: IMMUNE

## 2023-07-02 LAB — HEPATITIS C ANTIBODY: HCV Ab: NEGATIVE

## 2023-07-05 ENCOUNTER — Other Ambulatory Visit (HOSPITAL_COMMUNITY): Payer: Self-pay

## 2023-07-05 ENCOUNTER — Other Ambulatory Visit: Payer: Self-pay

## 2023-07-05 DIAGNOSIS — Z3481 Encounter for supervision of other normal pregnancy, first trimester: Secondary | ICD-10-CM | POA: Diagnosis not present

## 2023-07-05 DIAGNOSIS — Z113 Encounter for screening for infections with a predominantly sexual mode of transmission: Secondary | ICD-10-CM | POA: Diagnosis not present

## 2023-07-05 DIAGNOSIS — F419 Anxiety disorder, unspecified: Secondary | ICD-10-CM | POA: Diagnosis not present

## 2023-07-05 DIAGNOSIS — Z1151 Encounter for screening for human papillomavirus (HPV): Secondary | ICD-10-CM | POA: Diagnosis not present

## 2023-07-05 DIAGNOSIS — Z3A13 13 weeks gestation of pregnancy: Secondary | ICD-10-CM | POA: Diagnosis not present

## 2023-07-05 DIAGNOSIS — O09819 Supervision of pregnancy resulting from assisted reproductive technology, unspecified trimester: Secondary | ICD-10-CM | POA: Diagnosis not present

## 2023-07-05 DIAGNOSIS — Z124 Encounter for screening for malignant neoplasm of cervix: Secondary | ICD-10-CM | POA: Diagnosis not present

## 2023-07-05 MED ORDER — SERTRALINE HCL 50 MG PO TABS
50.0000 mg | ORAL_TABLET | Freq: Every day | ORAL | 2 refills | Status: DC
  Filled 2023-07-05 (×2): qty 30, 30d supply, fill #0
  Filled 2023-07-29: qty 30, 30d supply, fill #1
  Filled 2023-08-31: qty 30, 30d supply, fill #2

## 2023-07-05 MED ORDER — HYDROXYZINE PAMOATE 50 MG PO CAPS
50.0000 mg | ORAL_CAPSULE | Freq: Every day | ORAL | 2 refills | Status: AC
  Filled 2023-07-05 (×2): qty 30, 30d supply, fill #0
  Filled 2023-07-29: qty 30, 30d supply, fill #1
  Filled 2023-08-31: qty 30, 30d supply, fill #2

## 2023-07-16 ENCOUNTER — Other Ambulatory Visit (HOSPITAL_COMMUNITY): Payer: Self-pay

## 2023-07-17 ENCOUNTER — Other Ambulatory Visit: Payer: Self-pay

## 2023-07-17 ENCOUNTER — Other Ambulatory Visit (HOSPITAL_COMMUNITY): Payer: Self-pay

## 2023-07-17 MED ORDER — LABETALOL HCL 200 MG PO TABS
200.0000 mg | ORAL_TABLET | Freq: Two times a day (BID) | ORAL | 3 refills | Status: DC
  Filled 2023-07-17 (×3): qty 60, 30d supply, fill #0
  Filled 2023-08-12: qty 60, 30d supply, fill #1
  Filled 2023-09-12: qty 60, 30d supply, fill #2
  Filled 2023-10-17: qty 60, 30d supply, fill #3

## 2023-07-18 ENCOUNTER — Other Ambulatory Visit: Payer: Self-pay

## 2023-07-27 ENCOUNTER — Ambulatory Visit: Payer: 59 | Admitting: Family Medicine

## 2023-07-30 DIAGNOSIS — Z361 Encounter for antenatal screening for raised alphafetoprotein level: Secondary | ICD-10-CM | POA: Diagnosis not present

## 2023-08-23 ENCOUNTER — Other Ambulatory Visit: Payer: Self-pay

## 2023-08-23 ENCOUNTER — Inpatient Hospital Stay (HOSPITAL_COMMUNITY)
Admission: AD | Admit: 2023-08-23 | Discharge: 2023-08-24 | DRG: 779 | Disposition: A | Discharge status: Home / Self Care | Payer: Self-pay | Attending: Obstetrics and Gynecology | Admitting: Obstetrics and Gynecology

## 2023-08-23 ENCOUNTER — Encounter (HOSPITAL_COMMUNITY): Payer: Self-pay

## 2023-08-23 ENCOUNTER — Inpatient Hospital Stay (HOSPITAL_COMMUNITY): Admitting: Anesthesiology

## 2023-08-23 DIAGNOSIS — Z363 Encounter for antenatal screening for malformations: Secondary | ICD-10-CM | POA: Diagnosis not present

## 2023-08-23 DIAGNOSIS — O09812 Supervision of pregnancy resulting from assisted reproductive technology, second trimester: Secondary | ICD-10-CM | POA: Diagnosis not present

## 2023-08-23 DIAGNOSIS — Z3A18 18 weeks gestation of pregnancy: Secondary | ICD-10-CM

## 2023-08-23 DIAGNOSIS — Z3A22 22 weeks gestation of pregnancy: Secondary | ICD-10-CM | POA: Diagnosis not present

## 2023-08-23 DIAGNOSIS — O10912 Unspecified pre-existing hypertension complicating pregnancy, second trimester: Secondary | ICD-10-CM | POA: Diagnosis not present

## 2023-08-23 DIAGNOSIS — O364XX0 Maternal care for intrauterine death, not applicable or unspecified: Secondary | ICD-10-CM | POA: Diagnosis not present

## 2023-08-23 DIAGNOSIS — O411230 Chorioamnionitis, third trimester, not applicable or unspecified: Secondary | ICD-10-CM | POA: Diagnosis not present

## 2023-08-23 DIAGNOSIS — O021 Missed abortion: Secondary | ICD-10-CM | POA: Diagnosis not present

## 2023-08-23 DIAGNOSIS — O09522 Supervision of elderly multigravida, second trimester: Secondary | ICD-10-CM | POA: Diagnosis not present

## 2023-08-23 DIAGNOSIS — Z3400 Encounter for supervision of normal first pregnancy, unspecified trimester: Secondary | ICD-10-CM | POA: Diagnosis not present

## 2023-08-23 DIAGNOSIS — O43892 Other placental disorders, second trimester: Secondary | ICD-10-CM | POA: Diagnosis not present

## 2023-08-23 DIAGNOSIS — Z3A20 20 weeks gestation of pregnancy: Secondary | ICD-10-CM | POA: Diagnosis not present

## 2023-08-23 LAB — CBC
HCT: 34.9 % — ABNORMAL LOW (ref 36.0–46.0)
Hemoglobin: 12 g/dL (ref 12.0–15.0)
MCH: 29.8 pg (ref 26.0–34.0)
MCHC: 34.4 g/dL (ref 30.0–36.0)
MCV: 86.6 fL (ref 80.0–100.0)
Platelets: 350 10*3/uL (ref 150–400)
RBC: 4.03 MIL/uL (ref 3.87–5.11)
RDW: 13.5 % (ref 11.5–15.5)
WBC: 12.2 10*3/uL — ABNORMAL HIGH (ref 4.0–10.5)
nRBC: 0 % (ref 0.0–0.2)

## 2023-08-23 LAB — TYPE AND SCREEN
ABO/RH(D): B POS
Antibody Screen: NEGATIVE

## 2023-08-23 LAB — HIV ANTIBODY (ROUTINE TESTING W REFLEX): HIV Screen 4th Generation wRfx: NONREACTIVE

## 2023-08-23 MED ORDER — LIDOCAINE HCL (PF) 1 % IJ SOLN: 30.0000 mL | INTRAMUSCULAR | Status: DC | PRN

## 2023-08-23 MED ORDER — SERTRALINE HCL 50 MG PO TABS
50.0000 mg | ORAL_TABLET | Freq: Every day | ORAL | Status: DC
  Filled 2023-08-23: qty 1

## 2023-08-23 MED ORDER — ACETAMINOPHEN 325 MG PO TABS
650.0000 mg | ORAL_TABLET | ORAL | Status: DC | PRN
Start: 2023-08-23 — End: 2023-08-24

## 2023-08-23 MED ORDER — FENTANYL CITRATE (PF) 100 MCG/2ML IJ SOLN: 100.0000 ug | INTRAMUSCULAR | Status: DC | PRN

## 2023-08-23 MED ORDER — LACTATED RINGERS IV SOLN: 500.0000 mL | INTRAVENOUS | Status: DC | PRN

## 2023-08-23 MED ORDER — MISOPROSTOL 200 MCG PO TABS
ORAL_TABLET | ORAL | Status: AC
  Administered 2023-08-23: 400 ug via VAGINAL
  Filled 2023-08-23: qty 2

## 2023-08-23 MED ORDER — OXYCODONE-ACETAMINOPHEN 5-325 MG PO TABS: 2.0000 | ORAL_TABLET | ORAL | Status: DC | PRN

## 2023-08-23 MED ORDER — OXYTOCIN BOLUS FROM INFUSION: 333.0000 mL | Freq: Once | INTRAVENOUS | Status: DC

## 2023-08-23 MED ORDER — DIPHENHYDRAMINE HCL 50 MG/ML IJ SOLN: 12.5000 mg | INTRAMUSCULAR | Status: DC | PRN

## 2023-08-23 MED ORDER — EPHEDRINE 5 MG/ML INJ: 10.0000 mg | INTRAVENOUS | Status: DC | PRN

## 2023-08-23 MED ORDER — MISOPROSTOL 200 MCG PO TABS: 400.0000 ug | ORAL_TABLET | Freq: Four times a day (QID) | ORAL | Status: DC

## 2023-08-23 MED ORDER — LABETALOL HCL 200 MG PO TABS
200.0000 mg | ORAL_TABLET | Freq: Two times a day (BID) | ORAL | Status: DC
Start: 2023-08-23 — End: 2023-08-24
  Administered 2023-08-23: 200 mg via ORAL
  Filled 2023-08-23: qty 1

## 2023-08-23 MED ORDER — OXYTOCIN-SODIUM CHLORIDE 30-0.9 UT/500ML-% IV SOLN: 2.5000 [IU]/h | INTRAVENOUS | Status: DC

## 2023-08-23 MED ORDER — PHENYLEPHRINE 80 MCG/ML (10ML) SYRINGE FOR IV PUSH (FOR BLOOD PRESSURE SUPPORT): 80.0000 ug | PREFILLED_SYRINGE | INTRAVENOUS | Status: DC | PRN

## 2023-08-23 MED ORDER — LACTATED RINGERS IV SOLN: INTRAVENOUS | Status: DC

## 2023-08-23 MED ORDER — FENTANYL-BUPIVACAINE-NACL 0.5-0.125-0.9 MG/250ML-% EP SOLN
12.0000 mL/h | EPIDURAL | Status: DC | PRN
  Administered 2023-08-23: 12 mL/h via EPIDURAL
  Filled 2023-08-23: qty 250

## 2023-08-23 MED ORDER — LIDOCAINE HCL (PF) 1 % IJ SOLN
INTRAMUSCULAR | Status: DC | PRN
  Administered 2023-08-23: 10 mL via EPIDURAL

## 2023-08-23 MED ORDER — ONDANSETRON HCL 4 MG/2ML IJ SOLN
4.0000 mg | Freq: Four times a day (QID) | INTRAMUSCULAR | Status: DC | PRN
  Filled 2023-08-23: qty 2

## 2023-08-23 MED ORDER — OXYCODONE-ACETAMINOPHEN 5-325 MG PO TABS: 1.0000 | ORAL_TABLET | ORAL | Status: DC | PRN

## 2023-08-23 MED ORDER — LACTATED RINGERS IV SOLN: 500.0000 mL | Freq: Once | INTRAVENOUS | Status: AC

## 2023-08-23 MED ORDER — MISOPROSTOL 200 MCG PO TABS
400.0000 ug | ORAL_TABLET | ORAL | Status: DC
  Administered 2023-08-23: 400 ug via VAGINAL
  Filled 2023-08-23: qty 2

## 2023-08-23 MED ORDER — SOD CITRATE-CITRIC ACID 500-334 MG/5ML PO SOLN: 30.0000 mL | ORAL | Status: DC | PRN

## 2023-08-23 NOTE — Anesthesia Preprocedure Evaluation (Signed)
 Anesthesia Evaluation  Patient identified by MRN, date of birth, ID band Patient awake    Reviewed: Allergy & Precautions, NPO status , Patient's Chart, lab work & pertinent test results, reviewed documented beta blocker date and time   Airway Mallampati: II  TM Distance: >3 FB Neck ROM: Full    Dental no notable dental hx.    Pulmonary neg pulmonary ROS   Pulmonary exam normal breath sounds clear to auscultation       Cardiovascular hypertension (gHTN), Normal cardiovascular exam+ Valvular Problems/Murmurs MVP  Rhythm:Regular Rate:Normal     Neuro/Psych  PSYCHIATRIC DISORDERS Anxiety     negative neurological ROS     GI/Hepatic negative GI ROS, Neg liver ROS,,,  Endo/Other  negative endocrine ROS    Renal/GU negative Renal ROS  negative genitourinary   Musculoskeletal negative musculoskeletal ROS (+)    Abdominal   Peds  Hematology negative hematology ROS (+)   Anesthesia Other Findings IUFD  Reproductive/Obstetrics (+) Pregnancy                             Anesthesia Physical Anesthesia Plan  ASA: 3  Anesthesia Plan: Epidural   Post-op Pain Management:    Induction:   PONV Risk Score and Plan: Treatment may vary due to age or medical condition  Airway Management Planned: Natural Airway  Additional Equipment:   Intra-op Plan:   Post-operative Plan:   Informed Consent: I have reviewed the patients History and Physical, chart, labs and discussed the procedure including the risks, benefits and alternatives for the proposed anesthesia with the patient or authorized representative who has indicated his/her understanding and acceptance.       Plan Discussed with: Anesthesiologist  Anesthesia Plan Comments: (Patient identified. Risks, benefits, options discussed with patient including but not limited to bleeding, infection, nerve damage, paralysis, failed block, incomplete  pain control, headache, blood pressure changes, nausea, vomiting, reactions to medication, itching, and post partum back pain. Confirmed with bedside nurse the patient's most recent platelet count. Confirmed with the patient that they are not taking any anticoagulation, have any bleeding history or any family history of bleeding disorders. Patient expressed understanding and wishes to proceed. All questions were answered. )       Anesthesia Quick Evaluation

## 2023-08-23 NOTE — Anesthesia Procedure Notes (Signed)
 Epidural Patient location during procedure: OB Start time: 08/23/2023 5:40 PM End time: 08/23/2023 5:50 PM  Staffing Anesthesiologist: Grace Laura, MD Performed: anesthesiologist   Preanesthetic Checklist Completed: patient identified, IV checked, risks and benefits discussed, monitors and equipment checked, pre-op evaluation and timeout performed  Epidural Patient position: sitting Prep: DuraPrep and site prepped and draped Patient monitoring: continuous pulse ox, blood pressure, heart rate and cardiac monitor Approach: midline Location: L3-L4 Injection technique: LOR air  Needle:  Needle type: Tuohy  Needle gauge: 17 G Needle length: 9 cm Needle insertion depth: 6 cm Catheter type: closed end flexible Catheter size: 19 Gauge Catheter at skin depth: 11 cm Test dose: negative  Assessment Sensory level: T8 Events: blood not aspirated, no cerebrospinal fluid, injection not painful, no injection resistance, no paresthesia and negative IV test  Additional Notes Patient identified. Risks/Benefits/Options discussed with patient including but not limited to bleeding, infection, nerve damage, paralysis, failed block, incomplete pain control, headache, blood pressure changes, nausea, vomiting, reactions to medication both or allergic, itching and postpartum back pain. Confirmed with bedside nurse the patient's most recent platelet count. Confirmed with patient that they are not currently taking any anticoagulation, have any bleeding history or any family history of bleeding disorders. Patient expressed understanding and wished to proceed. All questions were answered. Sterile technique was used throughout the entire procedure. Please see nursing notes for vital signs. Test dose was given through epidural catheter and negative prior to continuing to dose epidural or start infusion. Warning signs of high block given to the patient including shortness of breath, tingling/numbness in hands,  complete motor block, or any concerning symptoms with instructions to call for help. Patient was given instructions on fall risk and not to get out of bed. All questions and concerns addressed with instructions to call with any issues or inadequate analgesia.  Reason for block:procedure for pain

## 2023-08-23 NOTE — H&P (Addendum)
 Adrienne Church is a 38 y.o. female presenting for IUFD at 18 weeks discovered by US  today at 20 1/7 weeks. Was seen today for anatomy scan US  and fetal demise confirmed measuring 18 weeks size, edemetous neck region and minimal AFI and breech presentation IVF pregnancy had declined AFP and NIPS.  She presents for delivery. OB History     Gravida  2   Para  1   Term  1   Preterm      AB      Living  1      SAB      IAB      Ectopic      Multiple      Live Births  1          Past Medical History:  Diagnosis Date   ADHD    Anxiety    MVP (mitral valve prolapse)    PCOS (polycystic ovarian syndrome)    Renal disorder    kidney stones   Past Surgical History:  Procedure Laterality Date   BREAST REDUCTION SURGERY     TENDON REPAIR     Family History: family history includes Kidney disease in her mother; Mental illness in her mother; Mitral valve prolapse in her father; Thyroid disease in her mother. Social History:  reports that she has never smoked. She has never used smokeless tobacco. She reports current alcohol use. She reports that she does not use drugs.     Maternal Diabetes: No Genetic Screening: Declined Maternal Ultrasounds/Referrals: Other: Fetal Ultrasounds or other Referrals:  Other:  Maternal Substance Abuse:  No Significant Maternal Medications:  Meds include: Zoloft and metformin Significant Maternal Lab Results:  None Number of Prenatal Visits:greater than 3 verified prenatal visits Maternal Vaccinations: Other Comments:  None  Review of Systems History Dilation: Closed Effacement (%): Thick Station: -3 Exam by:: Ina Manas RN Blood pressure (!) 158/72, pulse 91, temperature 98.6 F (37 C), temperature source Oral, resp. rate 17, height 5\' 5"  (1.651 m), weight 88.9 kg, last menstrual period 04/04/2023, SpO2 97%. Exam Physical Exam  Vitals and nursing note reviewed. Exam conducted with a chaperone present.  Constitutional:       Appearance: Normal appearance.  HENT:     Head: Normocephalic.  Eyes:     Pupils: Pupils are equal, round, and reactive to light.  Cardiovascular:     Rate and Rhythm: Normal rate and regular rhythm.     Pulses: Normal pulses.  Abdominal:     General: Abdomen is Gravid, nontender Neurological:     Mental Status: She is alert. Pt wishes Full resuscitation in the event of a code. Prenatal labs: ABO, Rh: --/--/B POS (04/17 1323) Antibody: NEG (04/17 1323) Rubella: Immune (02/24 0000) RPR: Nonreactive (02/24 0000)  HBsAg: Negative (02/24 0000)  HIV: Non Reactive (04/17 1322)  GBS:     Assessment/Plan: IUP with  18 embryonic demise  CHTN on meds.  Hx of PCOs on metformin Desires IOL- discussed cytotec induction of labor, risk of placenta requiring D&C.  Discussed comfort care, clergy, and support staff Discussed analgesia and rec CLEA Wants genetic testing of fetus after delivery   Adrienne Church 08/23/2023, 6:06 PM

## 2023-08-24 ENCOUNTER — Encounter (HOSPITAL_COMMUNITY): Payer: Self-pay | Admitting: Obstetrics and Gynecology

## 2023-08-24 DIAGNOSIS — Z3A22 22 weeks gestation of pregnancy: Secondary | ICD-10-CM | POA: Diagnosis not present

## 2023-08-24 DIAGNOSIS — O364XX0 Maternal care for intrauterine death, not applicable or unspecified: Secondary | ICD-10-CM | POA: Diagnosis not present

## 2023-08-24 LAB — CBC
HCT: 31.2 % — ABNORMAL LOW (ref 36.0–46.0)
HCT: 30 % — ABNORMAL LOW (ref 36.0–46.0)
Hemoglobin: 10.5 g/dL — ABNORMAL LOW (ref 12.0–15.0)
Hemoglobin: 10.1 g/dL — ABNORMAL LOW (ref 12.0–15.0)
MCH: 29.3 pg (ref 26.0–34.0)
MCH: 28.9 pg (ref 26.0–34.0)
MCHC: 33.7 g/dL (ref 30.0–36.0)
MCHC: 33.7 g/dL (ref 30.0–36.0)
MCV: 87.2 fL (ref 80.0–100.0)
MCV: 86 fL (ref 80.0–100.0)
Platelets: 292 10*3/uL (ref 150–400)
Platelets: 277 10*3/uL (ref 150–400)
RBC: 3.58 MIL/uL — ABNORMAL LOW (ref 3.87–5.11)
RBC: 3.49 MIL/uL — ABNORMAL LOW (ref 3.87–5.11)
RDW: 13.4 % (ref 11.5–15.5)
RDW: 13.4 % (ref 11.5–15.5)
WBC: 12.6 10*3/uL — ABNORMAL HIGH (ref 4.0–10.5)
WBC: 12.2 10*3/uL — ABNORMAL HIGH (ref 4.0–10.5)
nRBC: 0 % (ref 0.0–0.2)
nRBC: 0 % (ref 0.0–0.2)

## 2023-08-24 MED ORDER — BENZOCAINE-MENTHOL 20-0.5 % EX AERO: 1.0000 | INHALATION_SPRAY | CUTANEOUS | Status: DC | PRN

## 2023-08-24 MED ORDER — DIBUCAINE (PERIANAL) 1 % EX OINT: 1.0000 | TOPICAL_OINTMENT | CUTANEOUS | Status: DC | PRN

## 2023-08-24 MED ORDER — MEASLES, MUMPS & RUBELLA VAC IJ SOLR: 0.5000 mL | Freq: Once | INTRAMUSCULAR | Status: DC

## 2023-08-24 MED ORDER — WITCH HAZEL-GLYCERIN EX PADS: 1.0000 | MEDICATED_PAD | CUTANEOUS | Status: DC | PRN

## 2023-08-24 MED ORDER — TETANUS-DIPHTH-ACELL PERTUSSIS 5-2.5-18.5 LF-MCG/0.5 IM SUSY: 0.5000 mL | PREFILLED_SYRINGE | Freq: Once | INTRAMUSCULAR | Status: DC

## 2023-08-24 MED ORDER — ZOLPIDEM TARTRATE 5 MG PO TABS: 5.0000 mg | ORAL_TABLET | Freq: Every evening | ORAL | Status: DC | PRN

## 2023-08-24 MED ORDER — COCONUT OIL OIL: 1.0000 | TOPICAL_OIL | Status: DC | PRN

## 2023-08-24 MED ORDER — SENNOSIDES-DOCUSATE SODIUM 8.6-50 MG PO TABS: 2.0000 | ORAL_TABLET | Freq: Every day | ORAL | Status: DC

## 2023-08-24 MED ORDER — LABETALOL HCL 200 MG PO TABS
200.0000 mg | ORAL_TABLET | Freq: Two times a day (BID) | ORAL | Status: DC
  Administered 2023-08-24: 200 mg via ORAL
  Filled 2023-08-24: qty 1

## 2023-08-24 MED ORDER — SERTRALINE HCL 50 MG PO TABS
50.0000 mg | ORAL_TABLET | Freq: Every day | ORAL | Status: DC
  Administered 2023-08-24: 50 mg via ORAL
  Filled 2023-08-24: qty 1

## 2023-08-24 MED ORDER — DIPHENHYDRAMINE HCL 25 MG PO CAPS: 25.0000 mg | ORAL_CAPSULE | Freq: Four times a day (QID) | ORAL | Status: DC | PRN

## 2023-08-24 MED ORDER — SIMETHICONE 80 MG PO CHEW: 80.0000 mg | CHEWABLE_TABLET | ORAL | Status: DC | PRN

## 2023-08-24 MED ORDER — IBUPROFEN 600 MG PO TABS
600.0000 mg | ORAL_TABLET | Freq: Four times a day (QID) | ORAL | Status: DC
  Administered 2023-08-24: 600 mg via ORAL
  Filled 2023-08-24: qty 1

## 2023-08-24 MED ORDER — PRENATAL MULTIVITAMIN CH: 1.0000 | ORAL_TABLET | Freq: Every day | ORAL | Status: DC

## 2023-08-24 MED ORDER — MEDROXYPROGESTERONE ACETATE 150 MG/ML IM SUSP: 150.0000 mg | INTRAMUSCULAR | Status: DC | PRN

## 2023-08-24 MED ORDER — ONDANSETRON HCL 4 MG/2ML IJ SOLN: 4.0000 mg | INTRAMUSCULAR | Status: DC | PRN

## 2023-08-24 MED ORDER — ACETAMINOPHEN 325 MG PO TABS: 650.0000 mg | ORAL_TABLET | ORAL | Status: DC | PRN

## 2023-08-24 MED ORDER — ONDANSETRON HCL 4 MG PO TABS: 4.0000 mg | ORAL_TABLET | ORAL | Status: DC | PRN

## 2023-08-24 MED ORDER — OXYCODONE-ACETAMINOPHEN 5-325 MG PO TABS: 1.0000 | ORAL_TABLET | ORAL | Status: DC | PRN

## 2023-08-24 MED ORDER — OXYCODONE-ACETAMINOPHEN 5-325 MG PO TABS: 2.0000 | ORAL_TABLET | ORAL | Status: DC | PRN

## 2023-08-24 NOTE — Progress Notes (Signed)
 Discharge instructions reviewed with patient, patient understands to continue her meds (labetalol  and zoloft ). Other meds on the AVS were confirmed to d/c per Dr. Serge Dancer and patient verbalizes understanding. Patient will f/u with her OB/GYN next week. Postpartum care reviewed, BP s/s to call MD, and PP depression things to be aware of reviewed with patient. Patient states that she has a good family support.

## 2023-08-24 NOTE — Anesthesia Postprocedure Evaluation (Signed)
 Anesthesia Post Note  Patient: Adrienne Church  Procedure(s) Performed: AN AD HOC LABOR EPIDURAL     Patient location during evaluation: OB High Risk Anesthesia Type: Epidural Level of consciousness: awake, oriented and awake and alert Pain management: pain level controlled Vital Signs Assessment: post-procedure vital signs reviewed and stable Respiratory status: spontaneous breathing, nonlabored ventilation and respiratory function stable Cardiovascular status: stable Postop Assessment: no headache, adequate PO intake, patient able to bend at knees, no backache, no apparent nausea or vomiting and able to ambulate Anesthetic complications: no   No notable events documented.  Last Vitals:  Vitals:   08/24/23 0508 08/24/23 0802  BP: (!) 141/72 (!) 143/78  Pulse: 92 72  Resp: 17 16  Temp:  37 C  SpO2:  99%    Last Pain:  Vitals:   08/24/23 0802  TempSrc: Oral  PainSc: 2    Pain Goal:                Epidural/Spinal Function Cutaneous sensation: Normal sensation (08/24/23 0802), Patient able to flex knees: Yes (08/24/23 0802), Patient able to lift hips off bed: Yes (08/24/23 0802), Back pain beyond tenderness at insertion site: No (08/24/23 0802), Progressively worsening motor and/or sensory loss: No (08/24/23 0802), Bowel and/or bladder incontinence post epidural: No (08/24/23 0802)  Soo Steelman

## 2023-08-24 NOTE — Plan of Care (Signed)
  Problem: Education: Goal: Knowledge of General Education information will improve Description: Including pain rating scale, medication(s)/side effects and non-pharmacologic comfort measures Outcome: Adequate for Discharge   Problem: Health Behavior/Discharge Planning: Goal: Ability to manage health-related needs will improve Outcome: Adequate for Discharge   Problem: Clinical Measurements: Goal: Ability to maintain clinical measurements within normal limits will improve Outcome: Adequate for Discharge Goal: Will remain free from infection Outcome: Adequate for Discharge Goal: Diagnostic test results will improve Outcome: Adequate for Discharge Goal: Respiratory complications will improve Outcome: Adequate for Discharge Goal: Cardiovascular complication will be avoided Outcome: Adequate for Discharge   Problem: Activity: Goal: Risk for activity intolerance will decrease Outcome: Adequate for Discharge   Problem: Nutrition: Goal: Adequate nutrition will be maintained Outcome: Adequate for Discharge   Problem: Coping: Goal: Level of anxiety will decrease Outcome: Adequate for Discharge   Problem: Elimination: Goal: Will not experience complications related to bowel motility Outcome: Adequate for Discharge Goal: Will not experience complications related to urinary retention Outcome: Adequate for Discharge   Problem: Pain Managment: Goal: General experience of comfort will improve and/or be controlled Outcome: Adequate for Discharge   Problem: Safety: Goal: Ability to remain free from injury will improve Outcome: Adequate for Discharge   Problem: Skin Integrity: Goal: Risk for impaired skin integrity will decrease Outcome: Adequate for Discharge   Problem: Education: Goal: Knowledge of Childbirth will improve Outcome: Adequate for Discharge Goal: Ability to make informed decisions regarding treatment and plan of care will improve Outcome: Adequate for  Discharge Goal: Ability to state and carry out methods to decrease the pain will improve Outcome: Adequate for Discharge Goal: Individualized Educational Video(s) Outcome: Adequate for Discharge   Problem: Coping: Goal: Ability to verbalize concerns and feelings about labor and delivery will improve Outcome: Adequate for Discharge   Problem: Life Cycle: Goal: Ability to make normal progression through stages of labor will improve Outcome: Adequate for Discharge Goal: Ability to effectively push during vaginal delivery will improve Outcome: Adequate for Discharge   Problem: Role Relationship: Goal: Will demonstrate positive interactions with the child Outcome: Adequate for Discharge   Problem: Safety: Goal: Risk of complications during labor and delivery will decrease Outcome: Adequate for Discharge   Problem: Pain Management: Goal: Relief or control of pain from uterine contractions will improve Outcome: Adequate for Discharge   Problem: Education: Goal: Knowledge of condition will improve Outcome: Adequate for Discharge Goal: Individualized Educational Video(s) Outcome: Adequate for Discharge Goal: Individualized Newborn Educational Video(s) Outcome: Adequate for Discharge   Problem: Activity: Goal: Will verbalize the importance of balancing activity with adequate rest periods Outcome: Adequate for Discharge Goal: Ability to tolerate increased activity will improve Outcome: Adequate for Discharge   Problem: Coping: Goal: Ability to identify and utilize available resources and services will improve Outcome: Adequate for Discharge   Problem: Life Cycle: Goal: Chance of risk for complications during the postpartum period will decrease Outcome: Adequate for Discharge   Problem: Role Relationship: Goal: Ability to demonstrate positive interaction with newborn will improve Outcome: Adequate for Discharge   Problem: Skin Integrity: Goal: Demonstration of wound healing  without infection will improve Outcome: Adequate for Discharge

## 2023-08-24 NOTE — Discharge Summary (Signed)
 Admission Diagnosis: Fetal demise at 18 weeks  Discharge Diagnosis: Same  Hospital Course: 38 year old G 2 P 1011 at [redacted] weeks gestation presented for admission with IUFD at 18 weeks.  This was an IVF pregnancy.  She was admitted and delivered after cytotec  induction.  On the morning of PPD # 1 , she was having normal lochia and minimal cramping. Vital signs were stable.   BP (!) 143/78 (BP Location: Right Arm)   Pulse 72   Temp 98.6 F (37 C) (Oral)   Resp 16   Ht 5\' 5"  (1.651 m)   Wt 88.9 kg   LMP 04/04/2023   SpO2 99%   BMI 32.62 kg/m  Results for orders placed or performed during the hospital encounter of 08/23/23 (from the past 24 hours)  CBC     Status: Abnormal   Collection Time: 08/23/23  1:22 PM  Result Value Ref Range   WBC 12.2 (H) 4.0 - 10.5 K/uL   RBC 4.03 3.87 - 5.11 MIL/uL   Hemoglobin 12.0 12.0 - 15.0 g/dL   HCT 65.7 (L) 84.6 - 96.2 %   MCV 86.6 80.0 - 100.0 fL   MCH 29.8 26.0 - 34.0 pg   MCHC 34.4 30.0 - 36.0 g/dL   RDW 95.2 84.1 - 32.4 %   Platelets 350 150 - 400 K/uL   nRBC 0.0 0.0 - 0.2 %  HIV Antibody (routine testing w rflx)     Status: None   Collection Time: 08/23/23  1:22 PM  Result Value Ref Range   HIV Screen 4th Generation wRfx Non Reactive Non Reactive  Type and screen [facility]     Status: None   Collection Time: 08/23/23  1:23 PM  Result Value Ref Range   ABO/RH(D) B POS    Antibody Screen NEG    Sample Expiration      08/26/2023,2359 Performed at [facility], [address] [place], [place] [address]   CBC     Status: Abnormal   Collection Time: 08/24/23  2:47 AM  Result Value Ref Range   WBC 12.6 (H) 4.0 - 10.5 K/uL   RBC 3.58 (L) 3.87 - 5.11 MIL/uL   Hemoglobin 10.5 (L) 12.0 - 15.0 g/dL   HCT 72.5 (L) 36.6 - 44.0 %   MCV 87.2 80.0 - 100.0 fL   MCH 29.3 26.0 - 34.0 pg   MCHC 33.7 30.0 - 36.0 g/dL   RDW 34.7 42.5 - 95.6 %   Platelets 292 150 - 400 K/uL   nRBC 0.0 0.0 - 0.2 %  CBC     Status:  Abnormal   Collection Time: 08/24/23  5:09 AM  Result Value Ref Range   WBC 12.2 (H) 4.0 - 10.5 K/uL   RBC 3.49 (L) 3.87 - 5.11 MIL/uL   Hemoglobin 10.1 (L) 12.0 - 15.0 g/dL   HCT 38.7 (L) 56.4 - 33.2 %   MCV 86.0 80.0 - 100.0 fL   MCH 28.9 26.0 - 34.0 pg   MCHC 33.7 30.0 - 36.0 g/dL   RDW 95.1 88.4 - 16.6 %   Platelets 277 150 - 400 K/uL   nRBC 0.0 0.0 - 0.2 %   General alert and oriented Uterus is normal size and non tender  Lochia WNL  Patient discharged home  Chromosome studies are pending on the fetus  Considering cremation.

## 2023-08-27 LAB — SURGICAL PATHOLOGY

## 2023-09-04 ENCOUNTER — Telehealth (HOSPITAL_COMMUNITY): Payer: Self-pay

## 2023-09-04 NOTE — Telephone Encounter (Signed)
 09/04/2023 1459  Name: Adrienne Church MRN: [id number] DOB: 1986/01/24  Reason for Call:  Transition of Care Hospital Discharge Call  Contact Status: Patient Contact Status: Message  Language assistant needed:          Follow-Up Questions:    Dimple Francis Postnatal Depression Scale:  In the Past 7 Days:    PHQ2-9 Depression Scale:     Discharge Follow-up:    Post-discharge interventions: NA  Signature  Wadell Guild

## 2023-09-06 DIAGNOSIS — F4321 Adjustment disorder with depressed mood: Secondary | ICD-10-CM | POA: Diagnosis not present

## 2023-09-06 DIAGNOSIS — Z634 Disappearance and death of family member: Secondary | ICD-10-CM | POA: Diagnosis not present

## 2023-09-06 DIAGNOSIS — D6869 Other thrombophilia: Secondary | ICD-10-CM | POA: Diagnosis not present

## 2023-09-06 DIAGNOSIS — Z319 Encounter for procreative management, unspecified: Secondary | ICD-10-CM | POA: Diagnosis not present

## 2023-09-06 DIAGNOSIS — E559 Vitamin D deficiency, unspecified: Secondary | ICD-10-CM | POA: Diagnosis not present

## 2023-09-19 ENCOUNTER — Telehealth: Payer: Self-pay | Admitting: *Deleted

## 2023-09-19 NOTE — Telephone Encounter (Signed)
 Patient referred to hematology clinic, however called to advise that Dr. Farrel Hones at [facility] recommended that she could hold off on referral due to recent lab results, therefore no appointment scheduled at this time.

## 2023-09-26 ENCOUNTER — Other Ambulatory Visit (HOSPITAL_COMMUNITY): Payer: Self-pay

## 2023-09-26 MED ORDER — METFORMIN HCL 1000 MG PO TABS
1000.0000 mg | ORAL_TABLET | Freq: Two times a day (BID) | ORAL | 2 refills | Status: AC
  Filled 2023-09-26: qty 60, 30d supply, fill #0
  Filled 2023-12-13: qty 60, 30d supply, fill #1
  Filled 2024-05-14: qty 60, 30d supply, fill #2

## 2023-10-03 DIAGNOSIS — Z634 Disappearance and death of family member: Secondary | ICD-10-CM | POA: Diagnosis not present

## 2023-10-03 DIAGNOSIS — F4321 Adjustment disorder with depressed mood: Secondary | ICD-10-CM | POA: Diagnosis not present

## 2023-10-03 DIAGNOSIS — Z1389 Encounter for screening for other disorder: Secondary | ICD-10-CM | POA: Diagnosis not present

## 2023-10-04 ENCOUNTER — Other Ambulatory Visit (HOSPITAL_COMMUNITY): Payer: Self-pay

## 2023-10-04 MED ORDER — SERTRALINE HCL 50 MG PO TABS
50.0000 mg | ORAL_TABLET | Freq: Every day | ORAL | 2 refills | Status: DC
  Filled 2023-10-04: qty 30, 30d supply, fill #0
  Filled 2023-11-01: qty 30, 30d supply, fill #1
  Filled 2023-11-30: qty 30, 30d supply, fill #2

## 2023-10-16 ENCOUNTER — Ambulatory Visit (INDEPENDENT_AMBULATORY_CARE_PROVIDER_SITE_OTHER): Admitting: Podiatry

## 2023-10-16 ENCOUNTER — Ambulatory Visit: Admitting: Podiatry

## 2023-10-16 DIAGNOSIS — Z79899 Other long term (current) drug therapy: Secondary | ICD-10-CM

## 2023-10-16 DIAGNOSIS — B351 Tinea unguium: Secondary | ICD-10-CM

## 2023-10-16 NOTE — Patient Instructions (Signed)

## 2023-10-17 LAB — CBC WITH DIFFERENTIAL/PLATELET
Basophils Absolute: 0.1 10*3/uL (ref 0.0–0.2)
Basos: 1 %
EOS (ABSOLUTE): 0.2 10*3/uL (ref 0.0–0.4)
Eos: 2 %
Hematocrit: 38 % (ref 34.0–46.6)
Hemoglobin: 12.3 g/dL (ref 11.1–15.9)
Immature Grans (Abs): 0 10*3/uL (ref 0.0–0.1)
Immature Granulocytes: 0 %
Lymphocytes Absolute: 4 10*3/uL — ABNORMAL HIGH (ref 0.7–3.1)
Lymphs: 44 %
MCH: 29.1 pg (ref 26.6–33.0)
MCHC: 32.4 g/dL (ref 31.5–35.7)
MCV: 90 fL (ref 79–97)
Monocytes Absolute: 0.7 10*3/uL (ref 0.1–0.9)
Monocytes: 7 %
Neutrophils Absolute: 4.3 10*3/uL (ref 1.4–7.0)
Neutrophils: 46 %
Platelets: 292 10*3/uL (ref 150–450)
RBC: 4.23 x10E6/uL (ref 3.77–5.28)
RDW: 13.2 % (ref 11.7–15.4)
WBC: 9.3 10*3/uL (ref 3.4–10.8)

## 2023-10-17 LAB — HEPATIC FUNCTION PANEL
ALT: 33 IU/L — ABNORMAL HIGH (ref 0–32)
AST: 30 IU/L (ref 0–40)
Albumin: 4.5 g/dL (ref 3.9–4.9)
Alkaline Phosphatase: 82 IU/L (ref 44–121)
Bilirubin Total: <0.2 mg/dL (ref 0.0–1.2)
Bilirubin, Direct: <0.08 mg/dL (ref 0.00–0.40)
Total Protein: 7.2 g/dL (ref 6.0–8.5)

## 2023-10-17 NOTE — Progress Notes (Signed)
  Subjective:  Patient ID: Adrienne Church, female    DOB: [birth year],  MRN: [id number]  Chief Complaint  Patient presents with   Nail Problem    Rm 13 Thickened nails with discoloration on left foot. Patient is currently using 1%Tolnaftate antifungal     Discussed the use of AI scribe software for clinical note transcription with the patient, who gave verbal consent to proceed.  History of Present Illness Adrienne Church is a 38 year old female who presents with toenail issues.  She initially noticed thickening of one toenail, which progressed to a new nail growing over the existing one, causing discomfort due to separation on the hallux. Trimming the nail alleviated the discomfort without causing pain. Currently, there is no pain or drainage from the toenails. She states all the nails on the left are now affected.   She is using a topical antifungal medication, and is concerned about the progression of the condition beyond the initial toenail.  She is planning an embryo transfer in September, which influences her treatment options.  She is currently no pregnant or breastfeeding.       Objective:    Physical Exam General: AAO x3, NAD  Dermatological: Toenails on the left foot are hypertrophic, dystrophic with yellow discoloration.  The left hallux toenail has been trimmed back.  There is no signs of infection or open lesions.  Vascular: Dorsalis Pedis artery and Posterior Tibial artery pedal pulses are 2/4 bilateral with immedate capillary fill time. There is no pain with calf compression, swelling, warmth, erythema.   Neruologic: Grossly intact via light touch bilateral.  Musculoskeletal: No other areas of discomfort.   Gait: Unassisted, Nonantalgic.     No images are attached to the encounter.    Results     Assessment:   1. Long-term use of high-risk medication   2. Dermatophytosis of nail      Plan:  Patient was evaluated and treated and all questions  answered.  Assessment and Plan Assessment & Plan Onychomycosis Chronic onychomycosis with limited response to topical antifungal. Oral terbinafine recommended for higher efficacy. Discussed potential side effects and need for liver function monitoring. Treatment timing aligns with her reproductive plans. - Order liver function tests at [facility] before starting terbinafine. - Prescribe oral terbinafine for 90 days. - Order follow-up liver function tests 6 weeks after starting terbinafine. - Instruct to discontinue terbinafine if pregnancy occurs or if adverse effects develop. - Schedule follow-up towards the end of the 26-month treatment period.   Return in about 3 months (around 01/16/2024) for nail fungus .   Charity Conch DPM

## 2023-10-18 ENCOUNTER — Other Ambulatory Visit: Payer: Self-pay | Admitting: Podiatry

## 2023-10-18 ENCOUNTER — Encounter: Payer: Self-pay | Admitting: Podiatry

## 2023-10-18 DIAGNOSIS — R899 Unspecified abnormal finding in specimens from other organs, systems and tissues: Secondary | ICD-10-CM

## 2023-10-22 DIAGNOSIS — F4323 Adjustment disorder with mixed anxiety and depressed mood: Secondary | ICD-10-CM | POA: Diagnosis not present

## 2023-11-01 DIAGNOSIS — F4320 Adjustment disorder, unspecified: Secondary | ICD-10-CM | POA: Diagnosis not present

## 2023-11-01 DIAGNOSIS — F4323 Adjustment disorder with mixed anxiety and depressed mood: Secondary | ICD-10-CM | POA: Diagnosis not present

## 2023-11-08 DIAGNOSIS — F4320 Adjustment disorder, unspecified: Secondary | ICD-10-CM | POA: Diagnosis not present

## 2023-11-08 DIAGNOSIS — F4323 Adjustment disorder with mixed anxiety and depressed mood: Secondary | ICD-10-CM | POA: Diagnosis not present

## 2023-11-15 ENCOUNTER — Other Ambulatory Visit (HOSPITAL_COMMUNITY): Payer: Self-pay

## 2023-11-15 MED ORDER — LABETALOL HCL 200 MG PO TABS
200.0000 mg | ORAL_TABLET | Freq: Two times a day (BID) | ORAL | 3 refills | Status: DC
  Filled 2023-11-15: qty 60, 30d supply, fill #0
  Filled 2023-12-13: qty 60, 30d supply, fill #1
  Filled 2024-01-14: qty 60, 30d supply, fill #2
  Filled 2024-02-12: qty 60, 30d supply, fill #3

## 2023-11-22 ENCOUNTER — Encounter: Payer: Self-pay | Admitting: Podiatry

## 2023-11-22 DIAGNOSIS — F4320 Adjustment disorder, unspecified: Secondary | ICD-10-CM | POA: Diagnosis not present

## 2023-11-22 DIAGNOSIS — F4323 Adjustment disorder with mixed anxiety and depressed mood: Secondary | ICD-10-CM | POA: Diagnosis not present

## 2023-11-23 ENCOUNTER — Encounter (HOSPITAL_COMMUNITY): Payer: Self-pay

## 2023-11-23 ENCOUNTER — Other Ambulatory Visit (HOSPITAL_COMMUNITY): Payer: Self-pay

## 2023-11-23 ENCOUNTER — Telehealth (HOSPITAL_COMMUNITY): Payer: Self-pay

## 2023-11-23 ENCOUNTER — Other Ambulatory Visit: Payer: Self-pay | Admitting: Podiatry

## 2023-11-23 ENCOUNTER — Other Ambulatory Visit (HOSPITAL_BASED_OUTPATIENT_CLINIC_OR_DEPARTMENT_OTHER): Payer: Self-pay

## 2023-11-23 MED ORDER — CICLOPIROX 8 % EX SOLN
Freq: Every day | CUTANEOUS | 2 refills | Status: AC
  Filled 2023-11-23: qty 6.6, 30d supply, fill #0

## 2023-11-23 MED ORDER — EFINACONAZOLE 10 % EX SOLN: 1.0000 [drp] | Freq: Every day | CUTANEOUS | 11 refills | Status: AC

## 2023-11-23 MED ORDER — TAVABOROLE 5 % EX SOLN
1.0000 [drp] | Freq: Every day | CUTANEOUS | 2 refills | Status: AC
  Filled 2023-11-23: qty 10, 90d supply, fill #0

## 2023-11-23 NOTE — Telephone Encounter (Signed)
 Pharmacy Patient Advocate Encounter   Received notification from Pt Calls Messages that prior authorization for Tavaborole 5% solution  is required/requested.   Insurance verification completed.   The patient is insured through [facility] .   Per test claim: PA required; PA started via CoverMyMeds. KEY [id number] . Waiting for clinical questions to populate.

## 2023-11-23 NOTE — Telephone Encounter (Signed)
 PA request has been Received. New Encounter has been or will be created for follow up. For additional info see Pharmacy Prior Auth telephone encounter from 11/23/23.

## 2023-11-23 NOTE — Telephone Encounter (Signed)
 Pharmacy Patient Advocate Encounter   Received notification from CoverMyMeds that prior authorization for Tavaborole 5% solution  is required/requested.   Insurance verification completed.   The patient is insured through [facility] .   Per test claim: PA required; PA submitted to above mentioned insurance via CoverMyMeds Key/confirmation #/EOC [facility] Status is pending

## 2023-11-24 ENCOUNTER — Other Ambulatory Visit: Payer: Self-pay

## 2023-11-24 ENCOUNTER — Other Ambulatory Visit (HOSPITAL_COMMUNITY): Payer: Self-pay

## 2023-11-26 ENCOUNTER — Other Ambulatory Visit (HOSPITAL_COMMUNITY): Payer: Self-pay

## 2023-11-27 ENCOUNTER — Other Ambulatory Visit (HOSPITAL_COMMUNITY): Payer: Self-pay

## 2023-11-28 NOTE — Telephone Encounter (Signed)
 Pt got [place] .

## 2023-11-28 NOTE — Telephone Encounter (Signed)
 Pharmacy Patient Advocate Encounter  Received notification from [facility] that Prior Authorization for Tavaborole  5% solution  has been DENIED.  Full denial letter will be uploaded to the media tab. See denial reason below.   PA #/Case ID/Reference #: [id number]

## 2023-11-29 ENCOUNTER — Other Ambulatory Visit (HOSPITAL_COMMUNITY): Payer: Self-pay

## 2023-11-29 DIAGNOSIS — F4323 Adjustment disorder with mixed anxiety and depressed mood: Secondary | ICD-10-CM | POA: Diagnosis not present

## 2023-11-29 DIAGNOSIS — F4320 Adjustment disorder, unspecified: Secondary | ICD-10-CM | POA: Diagnosis not present

## 2023-11-30 ENCOUNTER — Other Ambulatory Visit (HOSPITAL_COMMUNITY): Payer: Self-pay

## 2023-12-03 ENCOUNTER — Inpatient Hospital Stay (HOSPITAL_BASED_OUTPATIENT_CLINIC_OR_DEPARTMENT_OTHER): Admitting: Medical Oncology

## 2023-12-03 ENCOUNTER — Inpatient Hospital Stay: Attending: Medical Oncology

## 2023-12-03 ENCOUNTER — Encounter: Payer: Self-pay | Admitting: Medical Oncology

## 2023-12-03 VITALS — BP 150/66 | HR 64 | Temp 98.6°F | Resp 18 | Ht 65.0 in | Wt 190.1 lb

## 2023-12-03 DIAGNOSIS — Z7901 Long term (current) use of anticoagulants: Secondary | ICD-10-CM | POA: Insufficient documentation

## 2023-12-03 DIAGNOSIS — D6852 Prothrombin gene mutation: Secondary | ICD-10-CM | POA: Insufficient documentation

## 2023-12-03 DIAGNOSIS — Z1589 Genetic susceptibility to other disease: Secondary | ICD-10-CM | POA: Diagnosis not present

## 2023-12-03 DIAGNOSIS — Z8759 Personal history of other complications of pregnancy, childbirth and the puerperium: Secondary | ICD-10-CM | POA: Diagnosis not present

## 2023-12-03 DIAGNOSIS — E282 Polycystic ovarian syndrome: Secondary | ICD-10-CM | POA: Diagnosis not present

## 2023-12-03 DIAGNOSIS — Z87442 Personal history of urinary calculi: Secondary | ICD-10-CM | POA: Insufficient documentation

## 2023-12-03 DIAGNOSIS — I341 Nonrheumatic mitral (valve) prolapse: Secondary | ICD-10-CM | POA: Diagnosis not present

## 2023-12-03 LAB — ANTITHROMBIN III: AntiThromb III Func: 96 % (ref 75–120)

## 2023-12-03 NOTE — Progress Notes (Signed)
 [facility] [phone]   [phone]  INITIAL CONSULT NOTE  Patient Care Team: Alphonsa Glendia LABOR, MD as PCP - General (Family Medicine)  CHIEF COMPLAINTS/PURPOSE OF CONSULTATION:  Hypercoagulable work up  HISTORY OF PRESENTING ILLNESS:  Adrienne Church 38 y.o. female is referred to our office by their Infertility team for hypercoagulable work up.  She had a miscarriage at [redacted] weeks gestation of an IVF pregnancy in April. Autopsy of her neonate showed possible underlying fetal thrombotic vasculopathy on placental pathology which led to PAI-1 Gene testing. 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 Her baby did not have any genetic abnormalities on testing. Of note, there was concern that the umbilical cord may have become entangled around her sons neck; possibly causing his demise. She is planning on trying IVF again in September with a plan to start heparin/Lovenox.   She has had one prior history pregnancy when she was 23 which was uneventful.   She is taking a prental vitamin daily   She does not smoke  She is not on hormonal medications at this time  She has no history of clotting events outside of recent pregnancy  She has no family history of clotting events or disorders.   She has no history of iron, B12 or folate deficiency  There has been no bleeding to her knowledge: denies epistaxis, gingivitis, hemoptysis, hematemesis, hematuria, melena, excessive bruising, blood donation.    MEDICAL HISTORY:  Past Medical History:  Diagnosis Date   ADHD    Anxiety    MVP (mitral valve prolapse)    PCOS (polycystic ovarian syndrome)    Renal disorder    kidney stones    SURGICAL HISTORY: Past Surgical History:  Procedure Laterality Date   BREAST REDUCTION SURGERY     TENDON REPAIR      SOCIAL HISTORY: Social History   Socioeconomic History   Marital status: Married    Spouse name: Not on file   Number of children: Not on  file   Years of education: Not on file   Highest education level: Not on file  Occupational History   Not on file  Tobacco Use   Smoking status: Never   Smokeless tobacco: Never  Vaping Use   Vaping status: Never Used  Substance and Sexual Activity   Alcohol  use: Yes    Comment: occ   Drug use: No   Sexual activity: Yes    Partners: Male    Birth control/protection: None  Other Topics Concern   Not on file  Social History Narrative   Not on file   Social Drivers of Health   Financial Resource Strain: Not on file  Food Insecurity: No Food Insecurity (08/23/2023)   Hunger Vital Sign    Worried About Running Out of Food in the Last Year: Never true    Ran Out of Food in the Last Year: Never true  Transportation Needs: No Transportation Needs (08/23/2023)   PRAPARE - Administrator, Civil Service (Medical): No    Lack of Transportation (Non-Medical): No  Physical Activity: Not on file  Stress: Not on file  Social Connections: Not on file  Intimate Partner Violence: Not on file    FAMILY HISTORY: Family History  Problem Relation Age of Onset   Kidney disease Mother    Mental illness Mother    Thyroid  disease Mother    Mitral valve prolapse Father     ALLERGIES:  is allergic  to azithromycin.  MEDICATIONS:  Current Outpatient Medications  Medication Sig Dispense Refill   albuterol  (VENTOLIN  HFA) 108 (90 Base) MCG/ACT inhaler Inhale 2 puffs into the lungs every 6 (six) hours as needed for wheezing or shortness of breath. 6.7 g 0   ALPRAZolam  (XANAX ) 0.5 MG tablet Take 1/2 to 1 tablet by mouth 2 times daily as needed. CAUTION: DROWSINESS 30 tablet 5   BABY ASPIRIN PO Take 81 mg by mouth daily.     ciclopirox  (PENLAC ) 8 % solution Apply topically at bedtime. Apply over nail and surrounding skin. Apply daily over previous coat. After seven (7) days,  remove with alcohol  and continue cycle. 6.6 mL 2   fluocinonide  cream (LIDEX ) 0.05 % Apply to affected area as  needed. 60 g 3   hydrOXYzine  (VISTARIL ) 50 MG capsule Take 1 capsule (50 mg total) by mouth at bedtime. 30 capsule 2   ketoconazole  (NIZORAL ) 2 % cream Apply topically 2 times daily as needed for irritation. 30 g 0   labetalol  (NORMODYNE ) 200 MG tablet Take 1 tablet (200 mg total) by mouth 2 (two) times daily. 60 tablet 3   metFORMIN  (GLUCOPHAGE ) 1000 MG tablet 1 tablet twice daily 60 tablet 2   nystatin  cream (MYCOSTATIN ) Apply to affected areas 2 times a day 60 g 0   ondansetron  (ZOFRAN ) 4 MG tablet Take 1 tablet (4 mg total) by mouth every 8 (eight) hours as needed for nausea or vomiting. 15 tablet 3   Prenatal Vit-Fe Fumarate-FA (PRENATAL VITAMIN PO) Take 1 tablet by mouth daily.     promethazine  (PHENERGAN ) 12.5 MG tablet Take 1 tablet (12.5 mg total) by mouth every 6 (six) hours as needed for nausea or vomiting. 20 tablet 0   sertraline  (ZOLOFT ) 50 MG tablet Take 1 tablet (50 mg total) by mouth daily. 30 tablet 2   VITAMIN D PO Take 5,000 Units by mouth daily.     Efinaconazole  10 % SOLN Apply 1 drop topically daily. (Patient not taking: Reported on 12/03/2023) 4 mL 11   estradiol  (ESTRACE ) 2 MG tablet Take 1 tablet (2 mg total) by mouth 2 (two) times daily. (Patient not taking: Reported on 12/03/2023) 60 tablet 4   estradiol  (VIVELLE -DOT) 0.1 MG/24HR patch Place 1 patch (0.1 mg total) onto the skin every 3 (three) days. (Patient not taking: Reported on 12/03/2023) 28 patch 3   lisdexamfetamine (VYVANSE ) 30 MG capsule Take 1 capsule (30 mg total) by mouth daily. (Patient not taking: Reported on 12/03/2023) 15 capsule 0   lisdexamfetamine (VYVANSE ) 30 MG capsule Take 1 capsule (30 mg total) by mouth daily. (Patient not taking: Reported on 12/03/2023) 30 capsule 0   lisdexamfetamine (VYVANSE ) 30 MG capsule Take 1 capsule (30 mg total) by mouth daily. (Patient not taking: Reported on 12/03/2023) 30 capsule 0   norethindrone  (AYGESTIN ) 5 MG tablet Take 1 tablet by mouth once daily for 14 days (Patient  not taking: Reported on 12/03/2023) 14 tablet 3   progesterone  (PROMETRIUM ) 200 MG capsule Take 1 capsule (200 mg total) at bedtime as directed. (Patient not taking: Reported on 12/03/2023) 14 capsule 0   progesterone  50 MG/ML injection Inject 1 mL (50 mg total) into the muscle daily. (Patient not taking: Reported on 12/03/2023) 30 mL 4   sertraline  (ZOLOFT ) 25 MG tablet Take 1 tablet (25 mg total) by mouth daily. 90 tablet 1   silver  sulfADIAZINE  (SILVADENE ) 1 % cream Apply 1 Application topically daily. 50 g 0   Tavaborole  (KERYDIN ) 5 %  SOLN Apply 1 drop to the affected toenail daily. 10 mL 2   valACYclovir  (VALTREX ) 1000 MG tablet Take 2 tablets now and may repeat 2 tablets in 12 hours when needed for cold sores (Patient not taking: Reported on 12/03/2023) 16 tablet 12   No current facility-administered medications for this visit.    REVIEW OF SYSTEMS:   Constitutional: ( - ) fevers, ( - )  chills , ( - ) night sweats Eyes: ( - ) blurriness of vision, ( - ) double vision, ( - ) watery eyes Ears, nose, mouth, throat, and face: ( - ) mucositis, ( - ) sore throat Respiratory: ( - ) cough, ( - ) dyspnea, ( - ) wheezes Cardiovascular: ( - ) palpitation, ( - ) chest discomfort, ( - ) lower extremity swelling Gastrointestinal:  ( - ) nausea, ( - ) heartburn, ( - ) change in bowel habits Skin: ( - ) abnormal skin rashes Lymphatics: ( - ) new lymphadenopathy, ( - ) easy bruising Neurological: ( - ) numbness, ( - ) tingling, ( - ) new weaknesses Behavioral/Psych: ( - ) mood change, ( - ) new changes  All other systems were reviewed with the patient and are negative.  PHYSICAL EXAMINATION: ECOG PERFORMANCE STATUS: 0 - Asymptomatic  Vitals:   12/03/23 1314  BP: (!) 150/66  Pulse: 64  Resp: 18  Temp: 98.6 F (37 C)  SpO2: 98%   Filed Weights   12/03/23 1314  Weight: 190 lb 1.3 oz (86.2 kg)    GENERAL: well appearing female in NAD  SKIN: skin color, texture, turgor are normal, no rashes or  significant lesions EYES: conjunctiva are pink and non-injected, sclera clear OROPHARYNX: no exudate, no erythema; lips, buccal mucosa, and tongue normal  NECK: supple, non-tender LYMPH:  no palpable lymphadenopathy in the cervical, axillary or supraclavicular lymph nodes.  LUNGS: clear to auscultation and percussion with normal breathing effort HEART: regular rate & rhythm and no murmurs and no lower extremity edema ABDOMEN: soft, non-tender, non-distended, normal bowel sounds Musculoskeletal: no cyanosis of digits and no clubbing  PSYCH: alert & oriented x 3, fluent speech NEURO: no focal motor/sensory deficits  LABORATORY DATA:  Pending   ASSESSMENT & PLAN Adrienne Church is a 38 y.o. 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 Testing from 09/06/2023 shows normal homocysteine level, normal TPO ab, normal Antiphospholipid syndrome assessment.   Today we will obtain the rest of our hypercoagulable panel which will help guide us  in long term anticoagulation management. Given her homozygous PAI-1 4G/4G mutation I would suggest long term anticoagulation. She is currently taking an 81 mg asa until she starts her IVF protocol for her upcoming transfer which is suspected to take place in September. She has a visit with her fertility specialists tomorrow. I have asked that she contact us  following this appointment to let us  know when they plan to start her medications. If they are planning on starting her heparin right away then we will hold off on DOAC therapy until after pregnancy/breastfeeding. If there is a sizable delay before she starts this medication then we will start her on either Xarelto or Eliquis.  We also discussed that she will need to be on Lovenox from delivery until she completes breastfeeding. Once she stops breastfeeding, or if she does not start breastfeeding, she will need to be on DOAC therapy.  We discussed that her  PAI-1 mutation does put her at increased risk of portal thrombus and is suggested to cause an increased risk of endometrial and ovarian cancers. She will work with her GYN regarding the later risks.   All questions were answered. The patient knows to call the clinic with any problems, questions or concerns.  I have spent a total of 55 minutes minutes of face-to-face and non-face-to-face time, preparing to see the patient, obtaining and/or reviewing separately obtained history, performing a medically appropriate examination, counseling and educating the patient, ordering medications/tests/procedures, referring and communicating with other health care professionals, documenting clinical information in the electronic health record, independently interpreting results and communicating results to the patient, and care coordination.    Lauraine Dais PA-C Department of Hematology/Oncology [facility]

## 2023-12-04 ENCOUNTER — Other Ambulatory Visit (HOSPITAL_COMMUNITY): Payer: Self-pay

## 2023-12-04 DIAGNOSIS — N711 Chronic inflammatory disease of uterus: Secondary | ICD-10-CM | POA: Diagnosis not present

## 2023-12-04 DIAGNOSIS — Z319 Encounter for procreative management, unspecified: Secondary | ICD-10-CM | POA: Diagnosis not present

## 2023-12-04 DIAGNOSIS — E559 Vitamin D deficiency, unspecified: Secondary | ICD-10-CM | POA: Diagnosis not present

## 2023-12-04 LAB — LUPUS ANTICOAGULANT PANEL
DRVVT: 39.5 s (ref 0.0–47.0)
PTT Lupus Anticoagulant: 32.6 s (ref 0.0–43.5)

## 2023-12-04 LAB — PROTEIN S, TOTAL: Protein S Ag, Total: 95 % (ref 60–150)

## 2023-12-04 LAB — PROTEIN S ACTIVITY: Protein S Activity: 87 % (ref 63–140)

## 2023-12-04 LAB — PROTEIN C ACTIVITY: Protein C Activity: 126 % (ref 73–180)

## 2023-12-04 MED ORDER — ESTRADIOL 2 MG PO TABS
2.0000 mg | ORAL_TABLET | Freq: Two times a day (BID) | ORAL | 2 refills | Status: AC
  Filled 2023-12-04: qty 60, 30d supply, fill #0
  Filled 2024-01-21: qty 60, 30d supply, fill #1

## 2023-12-04 MED ORDER — SHARPS CONTAINER MISC
2 refills | Status: AC
  Filled 2023-12-04: qty 1, 1d supply, fill #0

## 2023-12-04 MED ORDER — PROGESTERONE 50 MG/ML IM OIL
50.0000 mg | TOPICAL_OIL | Freq: Every day | INTRAMUSCULAR | 2 refills | Status: AC
  Filled 2023-12-04: qty 10, 10d supply, fill #0
  Filled 2024-01-10 (×2): qty 20, 20d supply, fill #1
  Filled 2024-01-10: qty 10, 10d supply, fill #1

## 2023-12-04 MED ORDER — METHYLPREDNISOLONE 8 MG PO TABS
8.0000 mg | ORAL_TABLET | Freq: Two times a day (BID) | ORAL | 2 refills | Status: AC
  Filled 2023-12-04: qty 8, 4d supply, fill #0

## 2023-12-04 MED ORDER — ESTRADIOL 0.1 MG/24HR TD PTTW
1.0000 | MEDICATED_PATCH | TRANSDERMAL | 2 refills | Status: AC
  Filled 2023-12-04: qty 8, 28d supply, fill #0
  Filled 2024-01-08: qty 8, 28d supply, fill #1
  Filled 2024-01-28 – 2024-01-30 (×3): qty 8, 28d supply, fill #2

## 2023-12-04 MED ORDER — DOXYCYCLINE HYCLATE 100 MG PO TABS
100.0000 mg | ORAL_TABLET | Freq: Two times a day (BID) | ORAL | 0 refills | Status: AC
  Filled 2023-12-04: qty 10, 5d supply, fill #0

## 2023-12-04 MED ORDER — SYRINGE/NEEDLE (DISP) 22G X 1-1/2" 3 ML MISC
2 refills | Status: AC
  Filled 2023-12-04: qty 30, 30d supply, fill #0

## 2023-12-04 MED ORDER — EASY TOUCH ALCOHOL PREP MEDIUM 70 % PADS
MEDICATED_PAD | 2 refills | Status: AC
  Filled 2023-12-04: qty 100, 90d supply, fill #0
  Filled 2024-01-28: qty 100, 90d supply, fill #1

## 2023-12-04 MED ORDER — NEEDLE (DISP) 18G X 1-1/2" MISC
2 refills | Status: AC
  Filled 2023-12-04 – 2023-12-05 (×2): qty 30, 30d supply, fill #0

## 2023-12-05 ENCOUNTER — Other Ambulatory Visit (HOSPITAL_COMMUNITY): Payer: Self-pay

## 2023-12-05 ENCOUNTER — Other Ambulatory Visit (HOSPITAL_BASED_OUTPATIENT_CLINIC_OR_DEPARTMENT_OTHER): Payer: Self-pay

## 2023-12-05 ENCOUNTER — Other Ambulatory Visit: Payer: Self-pay

## 2023-12-05 ENCOUNTER — Telehealth: Payer: Self-pay

## 2023-12-05 LAB — FACTOR 5 LEIDEN

## 2023-12-05 MED ORDER — LEVOTHYROXINE SODIUM 25 MCG PO TABS
ORAL_TABLET | ORAL | 3 refills | Status: AC
  Filled 2023-12-05: qty 30, 42d supply, fill #0
  Filled 2024-01-08: qty 30, 42d supply, fill #1
  Filled 2024-02-05 – 2024-02-11 (×2): qty 30, 42d supply, fill #2

## 2023-12-05 NOTE — Telephone Encounter (Signed)
 Patient called after seeing her infertility clinic stating they will start her on heparin once she receives a postivie preganacy test, she said tenatively 2nd week of September. until then they wanted her on a low dose aspirin. she said we can call her back if you recommend something else.  Discussed with [facility] who requests patient check with them if she can be on Xarelto up to 2 days prior to embryo transfer.   Called patient,  she will check with her physician and let us  know.  Patient states she is unable to send mychart message directly to our office. Sent a Clinical cytogeneticist message for her to respond back with per request.

## 2023-12-06 DIAGNOSIS — F4320 Adjustment disorder, unspecified: Secondary | ICD-10-CM | POA: Diagnosis not present

## 2023-12-06 DIAGNOSIS — F4323 Adjustment disorder with mixed anxiety and depressed mood: Secondary | ICD-10-CM | POA: Diagnosis not present

## 2023-12-06 LAB — PROTEIN C, TOTAL: Protein C, Total: 123 % (ref 60–150)

## 2023-12-07 LAB — PROTHROMBIN GENE MUTATION

## 2023-12-10 ENCOUNTER — Ambulatory Visit: Payer: Self-pay | Admitting: Medical Oncology

## 2023-12-12 ENCOUNTER — Other Ambulatory Visit (HOSPITAL_COMMUNITY): Payer: Self-pay

## 2023-12-12 DIAGNOSIS — F4323 Adjustment disorder with mixed anxiety and depressed mood: Secondary | ICD-10-CM | POA: Diagnosis not present

## 2023-12-12 DIAGNOSIS — F4320 Adjustment disorder, unspecified: Secondary | ICD-10-CM | POA: Diagnosis not present

## 2023-12-12 MED ORDER — AMOXICILLIN-POT CLAVULANATE 500-125 MG PO TABS
1.0000 | ORAL_TABLET | Freq: Two times a day (BID) | ORAL | 0 refills | Status: AC
  Filled 2023-12-12: qty 20, 10d supply, fill #0

## 2023-12-12 MED ORDER — METRONIDAZOLE 500 MG PO TABS
500.0000 mg | ORAL_TABLET | Freq: Two times a day (BID) | ORAL | 0 refills | Status: AC
  Filled 2023-12-12: qty 20, 10d supply, fill #0

## 2023-12-30 ENCOUNTER — Other Ambulatory Visit (HOSPITAL_COMMUNITY): Payer: Self-pay

## 2023-12-31 ENCOUNTER — Other Ambulatory Visit (HOSPITAL_COMMUNITY): Payer: Self-pay

## 2023-12-31 ENCOUNTER — Other Ambulatory Visit (HOSPITAL_BASED_OUTPATIENT_CLINIC_OR_DEPARTMENT_OTHER): Payer: Self-pay

## 2023-12-31 MED ORDER — SERTRALINE HCL 50 MG PO TABS
50.0000 mg | ORAL_TABLET | Freq: Every day | ORAL | 2 refills | Status: AC
  Filled 2023-12-31: qty 30, 30d supply, fill #0
  Filled 2024-01-28: qty 30, 30d supply, fill #1
  Filled 2024-03-03: qty 30, 30d supply, fill #2

## 2024-01-03 DIAGNOSIS — F4323 Adjustment disorder with mixed anxiety and depressed mood: Secondary | ICD-10-CM | POA: Diagnosis not present

## 2024-01-04 ENCOUNTER — Other Ambulatory Visit (HOSPITAL_COMMUNITY): Payer: Self-pay

## 2024-01-04 MED ORDER — HEPARIN SODIUM (PORCINE) 5000 UNIT/ML IJ SOLN
INTRAMUSCULAR | 3 refills | Status: DC
  Filled 2024-01-04: qty 60, 30d supply, fill #0

## 2024-01-08 ENCOUNTER — Other Ambulatory Visit: Payer: Self-pay

## 2024-01-08 ENCOUNTER — Other Ambulatory Visit (HOSPITAL_COMMUNITY): Payer: Self-pay

## 2024-01-08 MED ORDER — HEPARIN SODIUM (PORCINE) 5000 UNIT/ML IJ SOLN
5000.0000 [IU] | Freq: Two times a day (BID) | INTRAMUSCULAR | 3 refills | Status: AC
  Filled 2024-01-08: qty 60, 30d supply, fill #0
  Filled 2024-02-05: qty 10, 5d supply, fill #1
  Filled 2024-02-06: qty 50, 25d supply, fill #1

## 2024-01-09 ENCOUNTER — Other Ambulatory Visit: Payer: Self-pay

## 2024-01-10 ENCOUNTER — Other Ambulatory Visit (HOSPITAL_COMMUNITY): Payer: Self-pay

## 2024-01-10 ENCOUNTER — Other Ambulatory Visit: Payer: Self-pay

## 2024-01-10 DIAGNOSIS — F4323 Adjustment disorder with mixed anxiety and depressed mood: Secondary | ICD-10-CM | POA: Diagnosis not present

## 2024-01-11 ENCOUNTER — Other Ambulatory Visit (HOSPITAL_COMMUNITY): Payer: Self-pay

## 2024-01-15 ENCOUNTER — Other Ambulatory Visit (HOSPITAL_COMMUNITY): Payer: Self-pay

## 2024-01-22 DIAGNOSIS — Z3200 Encounter for pregnancy test, result unknown: Secondary | ICD-10-CM | POA: Diagnosis not present

## 2024-01-23 DIAGNOSIS — Z3200 Encounter for pregnancy test, result unknown: Secondary | ICD-10-CM | POA: Diagnosis not present

## 2024-01-24 ENCOUNTER — Other Ambulatory Visit (HOSPITAL_COMMUNITY): Payer: Self-pay

## 2024-01-24 DIAGNOSIS — Z3200 Encounter for pregnancy test, result unknown: Secondary | ICD-10-CM | POA: Diagnosis not present

## 2024-01-24 DIAGNOSIS — F4323 Adjustment disorder with mixed anxiety and depressed mood: Secondary | ICD-10-CM | POA: Diagnosis not present

## 2024-01-24 MED ORDER — ONDANSETRON 4 MG PO TBDP
4.0000 mg | ORAL_TABLET | Freq: Three times a day (TID) | ORAL | 0 refills | Status: AC
  Filled 2024-01-24: qty 30, 10d supply, fill #0

## 2024-01-29 ENCOUNTER — Encounter (HOSPITAL_COMMUNITY): Payer: Self-pay

## 2024-01-29 ENCOUNTER — Other Ambulatory Visit: Payer: Self-pay

## 2024-01-29 ENCOUNTER — Other Ambulatory Visit (HOSPITAL_COMMUNITY): Payer: Self-pay

## 2024-02-05 ENCOUNTER — Other Ambulatory Visit: Payer: Self-pay

## 2024-02-05 ENCOUNTER — Other Ambulatory Visit (HOSPITAL_COMMUNITY): Payer: Self-pay

## 2024-02-06 ENCOUNTER — Other Ambulatory Visit: Payer: Self-pay

## 2024-02-06 DIAGNOSIS — O020 Blighted ovum and nonhydatidiform mole: Secondary | ICD-10-CM | POA: Diagnosis not present

## 2024-02-06 DIAGNOSIS — Z319 Encounter for procreative management, unspecified: Secondary | ICD-10-CM | POA: Diagnosis not present

## 2024-02-06 DIAGNOSIS — Z3200 Encounter for pregnancy test, result unknown: Secondary | ICD-10-CM | POA: Diagnosis not present

## 2024-02-07 ENCOUNTER — Other Ambulatory Visit: Payer: Self-pay

## 2024-02-11 ENCOUNTER — Encounter (HOSPITAL_COMMUNITY): Payer: Self-pay

## 2024-02-11 ENCOUNTER — Other Ambulatory Visit (HOSPITAL_COMMUNITY): Payer: Self-pay

## 2024-02-14 DIAGNOSIS — O021 Missed abortion: Secondary | ICD-10-CM | POA: Diagnosis not present

## 2024-02-15 DIAGNOSIS — O021 Missed abortion: Secondary | ICD-10-CM | POA: Diagnosis not present

## 2024-02-20 ENCOUNTER — Other Ambulatory Visit: Payer: Self-pay | Admitting: Medical Genetics

## 2024-02-20 DIAGNOSIS — Z006 Encounter for examination for normal comparison and control in clinical research program: Secondary | ICD-10-CM

## 2024-03-16 ENCOUNTER — Other Ambulatory Visit (HOSPITAL_COMMUNITY): Payer: Self-pay

## 2024-03-17 ENCOUNTER — Other Ambulatory Visit: Payer: Self-pay

## 2024-03-17 ENCOUNTER — Other Ambulatory Visit (HOSPITAL_COMMUNITY): Payer: Self-pay

## 2024-03-17 MED ORDER — LABETALOL HCL 200 MG PO TABS
200.0000 mg | ORAL_TABLET | Freq: Two times a day (BID) | ORAL | 3 refills | Status: AC
  Filled 2024-03-17: qty 60, 30d supply, fill #0
  Filled 2024-04-14: qty 60, 30d supply, fill #1
  Filled 2024-05-14: qty 60, 30d supply, fill #2

## 2024-03-19 ENCOUNTER — Other Ambulatory Visit: Payer: Self-pay | Admitting: Family Medicine

## 2024-03-20 ENCOUNTER — Other Ambulatory Visit (HOSPITAL_COMMUNITY): Payer: Self-pay

## 2024-03-20 ENCOUNTER — Other Ambulatory Visit: Payer: Self-pay

## 2024-03-20 MED FILL — Valacyclovir HCl Tab 1 GM: ORAL | 4 days supply | Qty: 16 | Fill #0 | Status: AC

## 2024-03-28 ENCOUNTER — Other Ambulatory Visit (HOSPITAL_COMMUNITY): Payer: Self-pay

## 2024-03-28 DIAGNOSIS — N711 Chronic inflammatory disease of uterus: Secondary | ICD-10-CM | POA: Diagnosis not present

## 2024-03-28 DIAGNOSIS — Z3141 Encounter for fertility testing: Secondary | ICD-10-CM | POA: Diagnosis not present

## 2024-03-28 DIAGNOSIS — N96 Recurrent pregnancy loss: Secondary | ICD-10-CM | POA: Diagnosis not present

## 2024-03-28 MED ORDER — DOXYCYCLINE HYCLATE 100 MG PO TABS
100.0000 mg | ORAL_TABLET | Freq: Two times a day (BID) | ORAL | 0 refills | Status: AC
  Filled 2024-03-28: qty 10, 5d supply, fill #0

## 2024-04-04 ENCOUNTER — Other Ambulatory Visit (HOSPITAL_COMMUNITY): Payer: Self-pay

## 2024-04-11 ENCOUNTER — Other Ambulatory Visit (HOSPITAL_COMMUNITY): Payer: Self-pay

## 2024-04-11 MED ORDER — METRONIDAZOLE 500 MG PO TABS
500.0000 mg | ORAL_TABLET | Freq: Two times a day (BID) | ORAL | 0 refills | Status: AC
  Filled 2024-04-11: qty 20, 10d supply, fill #0

## 2024-04-11 MED ORDER — AMOXICILLIN-POT CLAVULANATE 500-125 MG PO TABS
1.0000 | ORAL_TABLET | Freq: Two times a day (BID) | ORAL | 0 refills | Status: AC
  Filled 2024-04-11: qty 20, 10d supply, fill #0

## 2024-04-25 DIAGNOSIS — N912 Amenorrhea, unspecified: Secondary | ICD-10-CM | POA: Diagnosis not present

## 2024-04-25 DIAGNOSIS — N926 Irregular menstruation, unspecified: Secondary | ICD-10-CM | POA: Diagnosis not present

## 2024-04-29 ENCOUNTER — Other Ambulatory Visit (HOSPITAL_COMMUNITY): Payer: Self-pay

## 2024-04-29 ENCOUNTER — Other Ambulatory Visit: Payer: Self-pay

## 2024-04-29 ENCOUNTER — Encounter (HOSPITAL_COMMUNITY): Payer: Self-pay

## 2024-04-29 MED ORDER — EASY TOUCH ALCOHOL PREP MEDIUM 70 % PADS
MEDICATED_PAD | 2 refills | Status: AC
  Filled 2024-04-29: qty 100, 100d supply, fill #0

## 2024-04-29 MED ORDER — "NEEDLE (DISP) 22G X 1-1/2"" MISC"
2 refills | Status: AC
  Filled 2024-04-29: qty 30, 30d supply, fill #0

## 2024-04-29 MED ORDER — "NEEDLE (DISP) 18G X 1-1/2"" MISC"
2 refills | Status: AC
  Filled 2024-04-29: qty 30, 30d supply, fill #0

## 2024-04-29 MED ORDER — ESTRADIOL 2 MG PO TABS
2.0000 mg | ORAL_TABLET | Freq: Two times a day (BID) | ORAL | 2 refills | Status: AC
  Filled 2024-04-29 – 2024-04-30 (×2): qty 60, 30d supply, fill #0
  Filled 2024-05-14: qty 60, 30d supply, fill #1

## 2024-04-29 MED ORDER — BD SHARPS CONTAINER HOME MISC
2 refills | Status: AC
  Filled 2024-04-29: qty 1, 30d supply, fill #0

## 2024-04-29 MED ORDER — HEPARIN SODIUM (PORCINE) 5000 UNIT/ML IJ SOLN
5000.0000 [IU] | Freq: Two times a day (BID) | INTRAMUSCULAR | 2 refills | Status: AC
  Filled 2024-04-29: qty 60, 30d supply, fill #0

## 2024-04-30 ENCOUNTER — Other Ambulatory Visit (HOSPITAL_COMMUNITY): Payer: Self-pay

## 2024-04-30 ENCOUNTER — Other Ambulatory Visit: Payer: Self-pay

## 2024-04-30 MED ORDER — PREDNISONE 10 MG PO TABS
10.0000 mg | ORAL_TABLET | Freq: Every day | ORAL | 3 refills | Status: AC
  Filled 2024-04-30: qty 30, 30d supply, fill #0

## 2024-04-30 MED ORDER — ESTRADIOL 0.1 MG/24HR TD PTTW
1.0000 | MEDICATED_PATCH | TRANSDERMAL | 2 refills | Status: AC
  Filled 2024-04-30: qty 8, 30d supply, fill #0
  Filled 2024-05-14 – 2024-05-24 (×3): qty 8, 30d supply, fill #1

## 2024-04-30 MED ORDER — SYRINGE (DISPOSABLE) 3 ML MISC: 2 refills | Status: AC

## 2024-04-30 MED ORDER — PROGESTERONE 50 MG/ML IM OIL
50.0000 mg | TOPICAL_OIL | Freq: Every day | INTRAMUSCULAR | 2 refills | Status: AC
  Filled 2024-04-30: qty 30, 30d supply, fill #0

## 2024-04-30 MED ORDER — NEUPOGEN 300 MCG/0.5ML IJ SOSY
PREFILLED_SYRINGE | INTRAMUSCULAR | 1 refills | Status: AC
  Filled 2024-04-30: qty 1, 1d supply, fill #0
  Filled 2024-04-30: qty 1, 28d supply, fill #0

## 2024-05-02 ENCOUNTER — Other Ambulatory Visit: Payer: Self-pay

## 2024-05-02 ENCOUNTER — Other Ambulatory Visit (HOSPITAL_COMMUNITY): Payer: Self-pay

## 2024-05-03 ENCOUNTER — Other Ambulatory Visit (HOSPITAL_COMMUNITY): Payer: Self-pay

## 2024-05-04 ENCOUNTER — Other Ambulatory Visit (HOSPITAL_COMMUNITY): Payer: Self-pay

## 2024-05-05 ENCOUNTER — Other Ambulatory Visit (HOSPITAL_COMMUNITY): Payer: Self-pay

## 2024-05-05 ENCOUNTER — Other Ambulatory Visit: Payer: Self-pay

## 2024-05-07 ENCOUNTER — Other Ambulatory Visit (HOSPITAL_COMMUNITY): Payer: Self-pay

## 2024-05-09 ENCOUNTER — Other Ambulatory Visit (HOSPITAL_COMMUNITY): Payer: Self-pay

## 2024-05-10 ENCOUNTER — Other Ambulatory Visit (HOSPITAL_COMMUNITY): Payer: Self-pay

## 2024-05-13 ENCOUNTER — Other Ambulatory Visit (HOSPITAL_COMMUNITY): Payer: Self-pay

## 2024-05-14 ENCOUNTER — Other Ambulatory Visit: Payer: Self-pay

## 2024-05-15 ENCOUNTER — Other Ambulatory Visit: Payer: Self-pay

## 2024-05-15 ENCOUNTER — Encounter: Payer: Self-pay | Admitting: Pharmacist

## 2024-05-15 ENCOUNTER — Encounter (HOSPITAL_COMMUNITY): Payer: Self-pay

## 2024-05-15 ENCOUNTER — Other Ambulatory Visit (HOSPITAL_COMMUNITY): Payer: Self-pay

## 2024-05-16 ENCOUNTER — Other Ambulatory Visit (HOSPITAL_COMMUNITY): Payer: Self-pay

## 2024-05-21 ENCOUNTER — Other Ambulatory Visit (HOSPITAL_COMMUNITY): Payer: Self-pay

## 2024-05-22 ENCOUNTER — Other Ambulatory Visit (HOSPITAL_COMMUNITY): Payer: Self-pay

## 2024-05-24 ENCOUNTER — Encounter (HOSPITAL_COMMUNITY): Payer: Self-pay

## 2024-05-24 ENCOUNTER — Other Ambulatory Visit (HOSPITAL_COMMUNITY): Payer: Self-pay

## 2024-05-25 ENCOUNTER — Other Ambulatory Visit (HOSPITAL_COMMUNITY): Payer: Self-pay

## 2024-05-26 ENCOUNTER — Other Ambulatory Visit (HOSPITAL_COMMUNITY): Payer: Self-pay

## 2024-05-31 ENCOUNTER — Other Ambulatory Visit (HOSPITAL_COMMUNITY): Payer: Self-pay

## 2024-05-31 MED ORDER — NORETHINDRONE ACETATE 5 MG PO TABS
5.0000 mg | ORAL_TABLET | Freq: Every day | ORAL | 0 refills | Status: AC
  Filled 2024-05-31: qty 21, 21d supply, fill #0

## 2024-06-02 ENCOUNTER — Other Ambulatory Visit (HOSPITAL_COMMUNITY): Payer: Self-pay

## 2024-12-02 ENCOUNTER — Inpatient Hospital Stay

## 2024-12-02 ENCOUNTER — Ambulatory Visit: Admitting: Medical Oncology
